# Patient Record
Sex: Male | Born: 1960 | Race: White | Hispanic: No | Smoking: Former smoker
Health system: Southern US, Community
[De-identification: ages and names within clinical notes are randomized; demographics above are authoritative.]

## PROBLEM LIST (undated history)

## (undated) DIAGNOSIS — N200 Calculus of kidney: Secondary | ICD-10-CM

## (undated) DIAGNOSIS — K631 Perforation of intestine (nontraumatic): Secondary | ICD-10-CM

## (undated) HISTORY — PX: PLEURAL SCARIFICATION: SHX748

## (undated) HISTORY — DX: Perforation of intestine (nontraumatic): K63.1

---

## 2005-05-22 ENCOUNTER — Ambulatory Visit: Payer: Self-pay | Admitting: Family Medicine

## 2005-08-07 ENCOUNTER — Ambulatory Visit: Payer: Self-pay | Admitting: Family Medicine

## 2005-09-20 ENCOUNTER — Ambulatory Visit: Payer: Self-pay | Admitting: Family Medicine

## 2005-10-11 ENCOUNTER — Ambulatory Visit: Payer: Self-pay | Admitting: Family Medicine

## 2005-11-15 ENCOUNTER — Ambulatory Visit: Payer: Self-pay | Admitting: Family Medicine

## 2006-01-15 ENCOUNTER — Ambulatory Visit: Payer: Self-pay | Admitting: Family Medicine

## 2006-02-25 DIAGNOSIS — F339 Major depressive disorder, recurrent, unspecified: Secondary | ICD-10-CM | POA: Insufficient documentation

## 2006-02-25 DIAGNOSIS — J309 Allergic rhinitis, unspecified: Secondary | ICD-10-CM | POA: Insufficient documentation

## 2006-03-11 ENCOUNTER — Ambulatory Visit: Payer: Self-pay | Admitting: Family Medicine

## 2006-09-26 ENCOUNTER — Ambulatory Visit: Payer: Self-pay | Admitting: Family Medicine

## 2006-09-26 DIAGNOSIS — J45991 Cough variant asthma: Secondary | ICD-10-CM | POA: Insufficient documentation

## 2006-11-07 ENCOUNTER — Ambulatory Visit: Payer: Self-pay | Admitting: Family Medicine

## 2007-04-07 ENCOUNTER — Ambulatory Visit: Payer: Self-pay | Admitting: Family Medicine

## 2008-03-02 ENCOUNTER — Ambulatory Visit: Payer: Self-pay | Admitting: Family Medicine

## 2008-03-02 ENCOUNTER — Encounter: Admission: RE | Admit: 2008-03-02 | Discharge: 2008-03-02 | Payer: Self-pay | Admitting: Family Medicine

## 2008-03-04 ENCOUNTER — Encounter: Payer: Self-pay | Admitting: Family Medicine

## 2009-03-31 ENCOUNTER — Encounter: Admission: RE | Admit: 2009-03-31 | Discharge: 2009-03-31 | Payer: Self-pay | Admitting: Family Medicine

## 2009-03-31 ENCOUNTER — Ambulatory Visit: Payer: Self-pay | Admitting: Family Medicine

## 2009-08-25 ENCOUNTER — Ambulatory Visit: Payer: Self-pay | Admitting: Family Medicine

## 2010-06-12 ENCOUNTER — Ambulatory Visit
Admission: RE | Admit: 2010-06-12 | Discharge: 2010-06-12 | Payer: Self-pay | Source: Home / Self Care | Admitting: Emergency Medicine

## 2010-06-12 ENCOUNTER — Encounter: Payer: Self-pay | Admitting: Emergency Medicine

## 2010-06-13 ENCOUNTER — Telehealth (INDEPENDENT_AMBULATORY_CARE_PROVIDER_SITE_OTHER): Payer: Self-pay | Admitting: *Deleted

## 2010-06-21 NOTE — Assessment & Plan Note (Signed)
Summary: Sinusitis, Allergies   Vital Signs:  Patient profile:   50 year old male Height:      76 inches Weight:      228 pounds Temp:     98.7 degrees F oral Pulse rate:   79 / minute BP sitting:   123 / 77  (left arm) Cuff size:   large  Vitals Entered By: Kathlene November (August 25, 2009 2:53 PM) CC: nasal drainage, ears hurting, sore throat, sneezing, watery eyes- has bad allergies   Primary Care Provider:  Nani Gasser MD  CC:  nasal drainage, ears hurting, sore throat, sneezing, and watery eyes- has bad allergies.  History of Present Illness: nasal drainage, ears hurting, sore throat, sneezing, watery eyes- has bad allergies for about 2 weeks.  Lots of sinus pressure. Green thick nasal discharge.  havnig some stringy blood. No fever.  Using her claritin and sudafed every day. Also using mucinex.  Feels getting worse. Says his HA is severe.  Not usain any allergic nasal sprays. Wants the "shot that he was given before"    Current Medications (verified): 1)  Claritin 10 Mg Caps (Loratadine) .... Take 1 Tablet By Mouth Once A Day 2)  Sudafed 24 Hour 240 Mg Xr24h-Tab (Pseudoephedrine Hcl) .... Take Two By Mouth Daily  Allergies (verified): 1)  ! Demerol  Physical Exam  General:  Well-developed,well-nourished,in no acute distress; alert,appropriate and cooperative throughout examination Head:  Normocephalic and atraumatic without obvious abnormalities. No apparent alopecia or balding. Eyes:  No corneal or conjunctival inflammation noted. EOMI. Perrla.  Ears:  External ear exam shows no significant lesions or deformities.  Otoscopic examination reveals clear canals, tympanic membranes are intact bilaterally without bulging, retraction, inflammation or discharge. Hearing is grossly normal bilaterally. Nose:  External nasal examination shows no deformity or inflammation. Nasal mucosa are pink and moist without lesions or exudates. Mouth:  Oral mucosa and oropharynx without  lesions or exudates.  Teeth in good repair. Neck:  No deformities, masses, or tenderness noted. Chest Wall:  No deformities, masses, tenderness or gynecomastia noted. Lungs:  Normal respiratory effort, chest expands symmetrically. Lungs are clear to auscultation, no crackles or wheezes. Heart:  Normal rate and regular rhythm. S1 and S2 normal without gallop, murmur, click, rub or other extra sounds. Skin:  no rashes.   Cervical Nodes:  No lymphadenopathy noted Psych:  Cognition and judgment appear intact. Alert and cooperative with normal attention span and concentration. No apparent delusions, illusions, hallucinations   Impression & Recommendations:  Problem # 1:  SINUSITIS - ACUTE-NOS (ICD-461.9)  Will start astepro for acute relief of allergies and tx for bacgerial sinus infection. Explained how adding a nasal spraywill help him.  He was resistant at first because he thought it was like affrin and I expalined that it is different.  Can start ABX today.  Did look back and he was given solumedrol for acute severe allergies in 2008 so given another dose today.  F/u in one week if not better. Cna use IBU and Tylenol for the HA.   His updated medication list for this problem includes:    Sudafed 24 Hour 240 Mg Xr24h-tab (Pseudoephedrine hcl) .Marland Kitchen... Take two by mouth daily    Augmentin 875-125 Mg Tabs (Amoxicillin-pot clavulanate) .Marland Kitchen... Take 1 tablet by mouth two times a day for 10 days.    Astepro 0.15 % Soln (Azelastine hcl) .Marland Kitchen... 1 spray in each nostril once a day.  Orders: Solumedrol up to 125mg  (E4540) Admin of  Therapeutic Inj  intramuscular or subcutaneous (51025)  Problem # 2:  RHINITIS, ALLERGIC (ICD-477.9) Explained how adding a nasal spraywill help him.  He was resistant at first because he thought it was like affrin and I expalined that it is different.   Did look back and he was given solumedrol for acute severe allergies in 2008 so given another dose today. His updated medication  list for this problem includes:    Claritin 10 Mg Caps (Loratadine) .Marland Kitchen... Take 1 tablet by mouth once a day    Sudafed 24 Hour 240 Mg Xr24h-tab (Pseudoephedrine hcl) .Marland Kitchen... Take two by mouth daily    Astepro 0.15 % Soln (Azelastine hcl) .Marland Kitchen... 1 spray in each nostril once a day.  His updated medication list for this problem includes:    Claritin 10 Mg Caps (Loratadine) .Marland Kitchen... Take 1 tablet by mouth once a day    Sudafed 24 Hour 240 Mg Xr24h-tab (Pseudoephedrine hcl) .Marland Kitchen... Take two by mouth daily    Astepro 0.15 % Soln (Azelastine hcl) .Marland Kitchen... 1 spray in each nostril once a day.  Complete Medication List: 1)  Claritin 10 Mg Caps (Loratadine) .... Take 1 tablet by mouth once a day 2)  Sudafed 24 Hour 240 Mg Xr24h-tab (Pseudoephedrine hcl) .... Take two by mouth daily 3)  Augmentin 875-125 Mg Tabs (Amoxicillin-pot clavulanate) .... Take 1 tablet by mouth two times a day for 10 days. 4)  Astepro 0.15 % Soln (Azelastine hcl) .Marland Kitchen.. 1 spray in each nostril once a day.  Patient Instructions: 1)  Astepro 1 spray in each nostril two times a day for about 1 month.  2)  Call if not better in one week.  Prescriptions: AUGMENTIN 875-125 MG TABS (AMOXICILLIN-POT CLAVULANATE) Take 1 tablet by mouth two times a day for 10 days.  #20 x 0   Entered and Authorized by:   Nani Gasser MD   Signed by:   Nani Gasser MD on 08/25/2009   Method used:   Electronically to        [facility] # [phone]* (retail)       [address]       [place], [place]  [address]       Ph: [phone]       Fax: [phone]   RxID:   [id number]    Medication Administration  Injection # 1:    Medication: Solumedrol up to 125mg     Diagnosis: SINUSITIS - ACUTE-NOS (ICD-461.9)    Route: IM    Site: LUOQ gluteus    Exp Date: 12/19/2010    Lot #: [id number]    Mfr: Pharmacia    Patient tolerated injection without complications    Given by: Kathlene November (August 25, 2009 3:18 PM)  Orders  Added: 1)  Est. Patient Level III [38250] 2)  Solumedrol up to 125mg  [J2930] 3)  Admin of Therapeutic Inj  intramuscular or subcutaneous [53976]

## 2010-06-21 NOTE — Progress Notes (Signed)
  Phone Note Outgoing Call   Call placed by: Clemens Catholic LPN,  June 13, 2010 9:57 AM Call placed to: pts mother Summary of Call: call back:pts mom states that he is feeling a little better today. he is resting and able to keep ksome fluids down. advised mom to have him to call back if any questions or concerns. Initial call taken by: Clemens Catholic LPN,  June 13, 2010 10:04 AM

## 2010-06-21 NOTE — Letter (Signed)
Summary: Out of Work  [facility]  [address]   [place], [place] [address]   Phone: [phone]  Fax: [phone]    June 12, 2010   Employee:  [employer] [facility]    To Whom It May Concern:   For Medical reasons, please excuse the above named employee from work for the following dates:  June 12, 2010      If you need additional information, please feel free to contact our office.         Sincerely,    Hoyt Koch MD

## 2010-06-21 NOTE — Assessment & Plan Note (Signed)
Summary: VOMITING/DIARRHEA/NH   Vital Signs:  Patient Profile:   50 Years Old Male CC:      N/V/D, abdominal pain  x last night, HA and vomiting 4 days ago Height:     76 inches Weight:      221 pounds O2 Sat:      96 % O2 treatment:    Room Air Temp:     98.9 degrees F oral Pulse rate:   76 / minute Resp:     14 per minute BP sitting:   113 / 77  (left arm) Cuff size:   large  Vitals Entered By: Lajean Saver RN (June 12, 2010 9:18 AM)                  Updated Prior Medication List: CLARITIN 10 MG CAPS (LORATADINE) Take 1 tablet by mouth once a day ASTEPRO 0.15 % SOLN (AZELASTINE HCL) 1 spray in each nostril once a day.  Current Allergies (reviewed today): ! DEMEROLHistory of Present Illness History from: patient & mother Chief Complaint: N/V/D, abdominal pain  x last night, HA and vomiting 4 days ago History of Present Illness: +N/V/D over the past 4 days.  The worst is the V and N.  Mild dizziness.  Was feeling better yesterday and then went to [employer] and then started throwing up again.  He has had multiple contacts with people with simimilar symptoms in the past week.  Mild HA and fatigue.  No F/C.  No blood in emesis or diarrhea.  He did keep down Gatorade this morning.  REVIEW OF SYSTEMS Constitutional Symptoms      Denies fever, chills, night sweats, weight loss, weight gain, and fatigue.  Eyes       Denies change in vision, eye pain, eye discharge, glasses, contact lenses, and eye surgery. Ear/Nose/Throat/Mouth       Denies hearing loss/aids, change in hearing, ear pain, ear discharge, dizziness, frequent runny nose, frequent nose bleeds, sinus problems, sore throat, hoarseness, and tooth pain or bleeding.  Respiratory       Denies dry cough, productive cough, wheezing, shortness of breath, asthma, bronchitis, and emphysema/COPD.  Cardiovascular       Denies murmurs, chest pain, and tires easily with exhertion.    Gastrointestinal       Complains of stomach  pain, nausea/vomiting, and diarrhea.      Denies constipation, blood in bowel movements, and indigestion. Genitourniary       Denies painful urination, kidney stones, and loss of urinary control. Neurological       Complains of headaches.      Denies paralysis, seizures, and fainting/blackouts. Musculoskeletal       Denies muscle pain, joint pain, joint stiffness, decreased range of motion, redness, swelling, muscle weakness, and gout.  Skin       Denies bruising, unusual mles/lumps or sores, and hair/skin or nail changes.  Psych       Denies mood changes, temper/anger issues, anxiety/stress, speech problems, depression, and sleep problems. Other Comments: Patient c/o HA and vomiting 4 nights ago. Last night he developed N/V/D and abdominal pain.    Past History:  Past Medical History: AR asthma depression? Hx of Migraines  Past Surgical History: _Sinus surgery  Family History: Parkinson`s-father died, DM  Social History: Reviewed history from 02/25/2006 and no changes required. 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 Never smoked, no EtOH, no drugs, 4 caffeinated drinks per day.  No regular exercise. Physical Exam General appearance:  well developed, well nourished, mild-mod distress from nausea but improving with Zofran, does not appear acutely dehydrated Ears: normal, no lesions or deformities Nasal: mucosa pink, nonedematous, no septal deviation, turbinates normal Oral/Pharynx: tongue normal, posterior pharynx without erythema or exudate Chest/Lungs: no rales, wheezes, or rhonchi bilateral, breath sounds equal without effort Heart: regular rate and  rhythm, no murmur Abdomen: mild diffuse tenderness worse epigastric, abdomen soft without obvious organomegaly.  no guarding, no rebound, no RLQ pain, +BS4Q MSE: oriented to time, place, and person Assessment New Problems: NAUSEA WITH VOMITING (ICD-787.01)   Patient Education: Patient and/or caregiver  instructed in the following: rest, fluids. Demonstrates willingness to comply.  Plan New Medications/Changes: MECLIZINE HCL 25 MG TABS (MECLIZINE HCL) 1 by mouth q6 hrs as needed dizziness  #16 x 0, 06/12/2010, Hoyt Koch MD ZOFRAN ODT 4 MG TBDP (ONDANSETRON) 1 dissolvable tab by mouth Q4-6 hrs as needed for nausea  #16 x 0, 06/12/2010, Hoyt Koch MD  New Orders: Zofran 1mg . injection [J2405] Admin of Therapeutic Inj  intramuscular or subcutaneous [96372] New Patient Level III [[address]] Planning Comments:   Rx for Zofran ODT for nausea and Antivert for dizziness.  A 4mg  IM Zofran given in clinic and he is already feeling a little better.  Educated on bland BRAT diet for the next 5-7 days, hydrating with water and Gatorade, eating smaller but more frequent portions.  Work note given.  Keep clean and hygeinic to avoid passing to others.  Follow-up with your primary care physician if not improving or if getting worse or new more severe symptoms.   The patient and/or caregiver has been counseled thoroughly with regard to medications prescribed including dosage, schedule, interactions, rationale for use, and possible side effects and they verbalize understanding.  Diagnoses and expected course of recovery discussed and will return if not improved as expected or if the condition worsens. Patient and/or caregiver verbalized understanding.  Prescriptions: MECLIZINE HCL 25 MG TABS (MECLIZINE HCL) 1 by mouth q6 hrs as needed dizziness  #16 x 0   Entered and Authorized by:   Hoyt Koch MD   Signed by:   Hoyt Koch MD on 06/12/2010   Method used:   Print then Give to Patient   RxID:   [id number] ZOFRAN ODT 4 MG TBDP (ONDANSETRON) 1 dissolvable tab by mouth Q4-6 hrs as needed for nausea  #16 x 0   Entered and Authorized by:   Hoyt Koch MD   Signed by:   Hoyt Koch MD on 06/12/2010   Method used:   Print then Give to Patient   RxID:    [id number]   Medication Administration  Injection # 1:    Medication: Zofran 1mg . injection    Diagnosis: NAUSEA WITH VOMITING (ICD-787.01)    Route: IM    Site: LUOQ gluteus    Exp Date: 10/18/2012    Lot #: [id number]    Mfr: HIKMA    Patient tolerated injection without complications    Given by: Lajean Saver RN (June 12, 2010 9:22 AM)  Orders Added: 1)  Zofran 1mg . injection [J2405] 2)  Admin of Therapeutic Inj  intramuscular or subcutaneous [96372] 3)  New Patient Level III [32440]

## 2010-07-02 ENCOUNTER — Ambulatory Visit (INDEPENDENT_AMBULATORY_CARE_PROVIDER_SITE_OTHER): Payer: Commercial Indemnity | Admitting: Family Medicine

## 2010-07-02 ENCOUNTER — Encounter: Payer: Self-pay | Admitting: Family Medicine

## 2010-07-02 DIAGNOSIS — J029 Acute pharyngitis, unspecified: Secondary | ICD-10-CM | POA: Insufficient documentation

## 2010-07-03 ENCOUNTER — Ambulatory Visit: Payer: Self-pay | Admitting: Family Medicine

## 2010-07-11 NOTE — Assessment & Plan Note (Signed)
Summary: Sore Throat    Vital Signs:  Patient profile:   50 year old male Height:      76 inches Weight:      225 pounds Temp:     98.1 degrees F oral Pulse rate:   67 / minute BP sitting:   118 / 76  (right arm) Cuff size:   large  Vitals Entered By: Avon Gully CMA, Duncan Dull) (July 02, 2010 1:03 PM)  Primary Care Provider:  Nani Gasser MD   History of Present Illness: ST and ear pressure started today.  Feels a little bad yesterday. Some green nasal d/c this AM. No fever. No Cough. NO HA or facial pressure. Louann Sjogren has some type of febril illness last week. Says he is worried about missing work this week.   Habits & Providers  Alcohol-Tobacco-Diet     Tobacco Status: never  Allergies: 1)  ! Demerol  Physical Exam  General:  Well-developed,well-nourished,in no acute distress; alert,appropriate and cooperative throughout examination Head:  Normocephalic and atraumatic without obvious abnormalities. No apparent alopecia or balding. Eyes:  No corneal or conjunctival inflammation noted. EOMI. Perrla. Ears:  External ear exam shows no significant lesions or deformities.  Otoscopic examination reveals clear canals, tympanic membranes are intact bilaterally without bulging, retraction, inflammation or discharge. Hearing is grossly normal bilaterally. Nose:  External nasal examination shows no deformity or inflammation.  Mouth:  Oral mucosa and oropharynx without lesions or exudates. Mild cobbelstoning.  Teeth in good repair. Neck:  No deformities, masses, or tenderness noted. Lungs:  Normal respiratory effort, chest expands symmetrically. Lungs are clear to auscultation, no crackles or wheezes. Heart:  Normal rate and regular rhythm. S1 and S2 normal without gallop, murmur, click, rub or other extra sounds. Skin:  no rashes.   Cervical Nodes:  No lymphadenopathy noted Psych:  Cognition and judgment appear intact. Alert and cooperative with normal attention span and  concentration. No apparent delusions, illusions, hallucinations   Impression & Recommendations:  Problem # 1:  SORE THROAT (ICD-462)  Rapid strep is neg. He has no cerv LN and throat with minimal changes and no fever so likely viral. Call if not better in 5-6 days.   Orders: Rapid Strep ([address])  Complete Medication List: 1)  Claritin 10 Mg Caps (Loratadine) .... Take 1 tablet by mouth once a day 2)  Astepro 0.15 % Soln (Azelastine hcl) .Marland Kitchen.. 1 spray in each nostril once a day. 3)  Zofran Odt 4 Mg Tbdp (Ondansetron) .Marland Kitchen.. 1 dissolvable tab by mouth q4-6 hrs as needed for nausea 4)  Meclizine Hcl 25 Mg Tabs (Meclizine hcl) .Marland Kitchen.. 1 by mouth q6 hrs as needed dizziness  Patient Instructions: 1)   Call if not better in 5-6 days.    Orders Added: 1)  Est. Patient Level III [60454] 2)  Rapid Strep [09811]    Laboratory Results  Date/Time Received: 07/02/10 Date/Time Reported: 07/02/10  Other Tests  Rapid Strep: negative

## 2011-04-05 ENCOUNTER — Ambulatory Visit
Admission: RE | Admit: 2011-04-05 | Discharge: 2011-04-05 | Disposition: A | Discharge status: Home / Self Care | Payer: 59 | Source: Ambulatory Visit | Attending: Family Medicine | Admitting: Family Medicine

## 2011-04-05 ENCOUNTER — Ambulatory Visit (INDEPENDENT_AMBULATORY_CARE_PROVIDER_SITE_OTHER): Payer: 59 | Admitting: Family Medicine

## 2011-04-05 VITALS — BP 140/84 | HR 85 | Wt 226.0 lb

## 2011-04-05 DIAGNOSIS — S2239XA Fracture of one rib, unspecified side, initial encounter for closed fracture: Secondary | ICD-10-CM

## 2011-04-05 DIAGNOSIS — S43006A Unspecified dislocation of unspecified shoulder joint, initial encounter: Secondary | ICD-10-CM

## 2011-04-05 DIAGNOSIS — J9383 Other pneumothorax: Secondary | ICD-10-CM

## 2011-04-05 NOTE — Patient Instructions (Signed)
We will call you with the chest xray results.  
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
## 2011-04-18 ENCOUNTER — Other Ambulatory Visit: Payer: Self-pay | Admitting: Sports Medicine

## 2011-04-18 ENCOUNTER — Ambulatory Visit
Admission: RE | Admit: 2011-04-18 | Discharge: 2011-04-18 | Disposition: A | Discharge status: Home / Self Care | Payer: 59 | Source: Ambulatory Visit | Attending: Sports Medicine | Admitting: Sports Medicine

## 2011-04-18 DIAGNOSIS — S43006A Unspecified dislocation of unspecified shoulder joint, initial encounter: Secondary | ICD-10-CM

## 2011-05-30 ENCOUNTER — Emergency Department
Admission: EM | Admit: 2011-05-30 | Discharge: 2011-05-30 | Disposition: A | Discharge status: Home / Self Care | Payer: 59 | Source: Home / Self Care | Attending: Family Medicine | Admitting: Family Medicine

## 2011-05-30 DIAGNOSIS — R0781 Pleurodynia: Secondary | ICD-10-CM

## 2011-05-30 DIAGNOSIS — R079 Chest pain, unspecified: Secondary | ICD-10-CM

## 2011-05-30 DIAGNOSIS — J069 Acute upper respiratory infection, unspecified: Secondary | ICD-10-CM

## 2011-05-30 LAB — POCT INFLUENZA A/B: Influenza A, POC: NEGATIVE

## 2011-05-30 MED ORDER — CEFDINIR 300 MG PO CAPS: 300.0000 mg | ORAL_CAPSULE | Freq: Two times a day (BID) | ORAL | Status: DC

## 2011-05-30 MED ORDER — BENZONATATE 200 MG PO CAPS: 200.0000 mg | ORAL_CAPSULE | Freq: Every day | ORAL | Status: AC

## 2011-05-30 NOTE — ED Notes (Signed)
Pt c/o productive cough onset Monday.  Pt describes mucous as green in color.  Pt states he had one episode of emesis last night, denies diarrhea.  Pt states he has been using humidifier with no relief.
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
## 2011-07-25 ENCOUNTER — Ambulatory Visit
Admission: RE | Admit: 2011-07-25 | Discharge: 2011-07-25 | Disposition: A | Discharge status: Home / Self Care | Payer: 59 | Source: Ambulatory Visit | Attending: Sports Medicine | Admitting: Sports Medicine

## 2011-07-25 ENCOUNTER — Other Ambulatory Visit: Payer: Self-pay | Admitting: Sports Medicine

## 2011-07-25 DIAGNOSIS — R52 Pain, unspecified: Secondary | ICD-10-CM

## 2011-10-03 ENCOUNTER — Ambulatory Visit
Admission: RE | Admit: 2011-10-03 | Discharge: 2011-10-03 | Disposition: A | Discharge status: Home / Self Care | Payer: BC Managed Care – PPO | Source: Ambulatory Visit | Attending: Sports Medicine | Admitting: Sports Medicine

## 2011-10-03 ENCOUNTER — Other Ambulatory Visit: Payer: Self-pay | Admitting: Sports Medicine

## 2011-10-03 DIAGNOSIS — S2239XB Fracture of one rib, unspecified side, initial encounter for open fracture: Secondary | ICD-10-CM

## 2012-10-18 DIAGNOSIS — K631 Perforation of intestine (nontraumatic): Secondary | ICD-10-CM

## 2012-10-18 HISTORY — DX: Perforation of intestine (nontraumatic): K63.1

## 2012-10-18 HISTORY — PX: PARTIAL COLECTOMY: SHX5273

## 2012-11-02 ENCOUNTER — Emergency Department
Admission: EM | Admit: 2012-11-02 | Discharge: 2012-11-02 | Disposition: A | Discharge status: Other Acute Inpatient Hospital | Payer: BC Managed Care – PPO | Source: Home / Self Care | Attending: Family Medicine | Admitting: Family Medicine

## 2012-11-02 ENCOUNTER — Encounter: Payer: Self-pay | Admitting: *Deleted

## 2012-11-02 DIAGNOSIS — R109 Unspecified abdominal pain: Secondary | ICD-10-CM

## 2012-11-02 HISTORY — DX: Calculus of kidney: N20.0

## 2012-11-02 MED ORDER — ONDANSETRON HCL 4 MG/2ML IJ SOLN
4.0000 mg | Freq: Once | INTRAMUSCULAR | Status: AC
  Administered 2012-11-02: 4 mg via INTRAMUSCULAR

## 2012-11-02 NOTE — ED Provider Notes (Addendum)
History     CSN: [id number]  Arrival date & time 11/02/12  1453   First MD Initiated Contact with Patient 11/02/12 1502      Chief Complaint  Patient presents with  . Abdominal Pain    HPI  Abdominal pain and vomiting x 1 day  Woke up this am with severe pain  10/10  Generalized abdominal  Weakness Malaise.  Emesis non bilious/non bloody  No known GI history    No past medical history on file.  Past Surgical History  Procedure Laterality Date  . Pleural scarification      No family history on file.  History  Substance Use Topics  . Smoking status: Never Smoker   . Smokeless tobacco: Not on file  . Alcohol Use: No      Review of Systems  All other systems reviewed and are negative.    Allergies  Dilaudid; Meperidine hcl; and Morphine and related  Home Medications   Current Outpatient Rx  Name  Route  Sig  Dispense  Refill  . Cetirizine HCl (ZYRTEC PO)   Oral   Take by mouth.         . GuaiFENesin (MUCINEX PO)   Oral   Take by mouth.         Marland Kitchen ibuprofen (ADVIL,MOTRIN) 100 MG chewable tablet   Oral   Chew 100 mg by mouth every 8 (eight) hours as needed.           Marland Kitchen ibuprofen (ADVIL,MOTRIN) 200 MG tablet   Oral   Take 200 mg by mouth every 6 (six) hours as needed.           . loratadine (CLARITIN) 10 MG tablet   Oral   Take 10 mg by mouth daily.             BP 99/62  Pulse 72  Temp(Src) 98.6 F (37 C) (Oral)  Resp 22  SpO2 92%  Physical Exam  Constitutional: He appears distressed.  Mildly lethargic    Cardiovascular: Normal rate and regular rhythm.   Pulmonary/Chest: Effort normal.  Abdominal:  + diffuse abd TTP  Decreased bowel sounds    Neurological: He is alert.  Skin: Skin is warm. He is diaphoretic.    ED Course  Procedures (including critical care time)  Labs Reviewed - No data to display No results found.   1. Abdominal  pain, other specified site       MDM  DDx extremely broad including bowel  obstruction, gastritis, appendicitis, colitis.  Supplemental O2 placed.  IM zofran 4mg  x1  VSS stable. Tx to ER via EMS.             Doree Albee, MD 11/02/12 1516  Doree Albee, MD 11/02/12 [phone]

## 2012-11-02 NOTE — ED Notes (Addendum)
Terry Vaughan reports severe sudden onset abdominal pain @ 10 am today. Pain has been constant. He reports chills and vomiting, pain 10/10. He is lethargic and diaphoretic at times hard to awaken. 02 applied via [place]. Dr. Alvester Morin notified of patient's condition.

## 2012-11-05 ENCOUNTER — Telehealth: Payer: Self-pay | Admitting: *Deleted

## 2012-11-18 ENCOUNTER — Encounter: Payer: Self-pay | Admitting: Surgery

## 2012-11-27 ENCOUNTER — Telehealth: Payer: Self-pay | Admitting: *Deleted

## 2012-11-27 NOTE — Telephone Encounter (Addendum)
Pt's mother is calling because he needs something for anxiety. He has an appt on Wednesday. Dr. Linford Arnold will not call any meds in for him w/o being seen. called his mother back and informed her of this and made an appt for monday she will try to get him in if she can get a ride. i did not cancel the appt for wednesday in case she can not make it. she understood and agreedBarkley, Viann Shove'

## 2012-11-30 ENCOUNTER — Ambulatory Visit (INDEPENDENT_AMBULATORY_CARE_PROVIDER_SITE_OTHER): Payer: BC Managed Care – PPO | Admitting: Family Medicine

## 2012-11-30 ENCOUNTER — Encounter: Payer: Self-pay | Admitting: Family Medicine

## 2012-11-30 VITALS — BP 106/67 | HR 93 | Ht 72.0 in | Wt 207.0 lb

## 2012-11-30 DIAGNOSIS — F411 Generalized anxiety disorder: Secondary | ICD-10-CM

## 2012-11-30 DIAGNOSIS — B370 Candidal stomatitis: Secondary | ICD-10-CM

## 2012-11-30 DIAGNOSIS — R3911 Hesitancy of micturition: Secondary | ICD-10-CM

## 2012-11-30 DIAGNOSIS — K631 Perforation of intestine (nontraumatic): Secondary | ICD-10-CM

## 2012-11-30 DIAGNOSIS — K5732 Diverticulitis of large intestine without perforation or abscess without bleeding: Secondary | ICD-10-CM

## 2012-11-30 MED ORDER — FLUOXETINE HCL 10 MG PO CAPS: ORAL_CAPSULE | ORAL | Status: DC

## 2012-11-30 MED ORDER — FLUCONAZOLE 150 MG PO TABS: 150.0000 mg | ORAL_TABLET | Freq: Every day | ORAL | Status: DC

## 2012-11-30 MED ORDER — CLONAZEPAM 0.25 MG PO TBDP: 0.2500 mg | ORAL_TABLET | Freq: Every evening | ORAL | Status: DC | PRN

## 2012-11-30 NOTE — Progress Notes (Signed)
  Subjective:    Patient ID: Terry Vaughan, male    DOB: July 26, 1960, 52 y.o.   MRN: [id number]  HPI Hospitalized for 14 days for bowel perforation.  His sister has been doing his wound care.  His anxiety level has been out of control.  Still awaiting ostomy supples.  They are hoping to reverse it in 6 months.  His sister is actually doing his wound care which is fantastic. He has had very little pain and discomfort. The last time he had to take a pain medication was about 2 days ago at bedtime. He has noticed a lot of general soreness in his abdomen.  Throat is still really sore.  He pulled his NG tube right after surgery adn they had to replace it.   He is comparable bottle Magic mouthwash and says his throat is still sore and tender.  Still having diffuculty starting urinary stream. After having his urinary catheter remained he had problems starting urination. He was put on Flomax in the hospital. He was unsure if he should continue or not. He is still noticed a little bit of difficulty starting urination a little bit weaker stream than usual. Still having dysuria that started immediately after having the catheter removed. No hematuria.   Review of Systems     Objective:   Physical Exam  Constitutional: He is oriented to person, place, and time. He appears well-developed and well-nourished.  HENT:  Head: Normocephalic and atraumatic.  Nose: Nose normal.  Mouth/Throat: Oropharynx is clear and moist.  Cardiovascular: Normal rate, regular rhythm and normal heart sounds.   Pulmonary/Chest: Effort normal and breath sounds normal.  Abdominal: Soft. Bowel sounds are normal. He exhibits no distension and no mass. There is no tenderness. There is no rebound and no guarding.  Ostomy site appears clean and intact. There is no stool in the bag.  Neurological: He is alert and oriented to person, place, and time.  Skin: Skin is warm and dry.  Psychiatric: He has a normal mood and affect. His  behavior is normal.          Assessment & Plan:  GI perforation secondary to diverticulitis - Healing well. Has f/u with GI [facility] with Dr. Charise Carwin.    Thrush - I. was unable to see any evidence of thrush on exam he still has a very sore throat after using the Magic mouthwash. We'll go ahead and treat with 3 days of Diflucan. If his throat is still sore by Monday then please call the office back.  Anxiety-this is been very anxiety provoking for him he still having a very difficult time with a peer with discussed putting him on an anxiolytic medication for a short period time. Probably over the next 6 months until they're able to reverse his ileostomy. He said he was okay with doing this. We'll start fluoxetine 10 mg daily for one week and then increase to 20 mg. I gave him a small supply of clonazepam to use at bedtime only at least until I see him back. Warned about potential for sedation and to avoid taking before driving.  Urinary hesitancy-continue Flomax for now. He still having difficulty starting history and has numbness and weakness since being catheterized at the hospital. He was started on the Flomax at the hospital. Refill sent to pharmacy today. It is improving as is see him back then we'll discontinue the medication.

## 2012-12-01 ENCOUNTER — Encounter: Payer: Self-pay | Admitting: Family Medicine

## 2012-12-02 ENCOUNTER — Telehealth: Payer: Self-pay | Admitting: *Deleted

## 2012-12-02 ENCOUNTER — Ambulatory Visit: Payer: BC Managed Care – PPO | Admitting: Family Medicine

## 2012-12-02 MED ORDER — TAMSULOSIN HCL 0.4 MG PO CAPS: 0.4000 mg | ORAL_CAPSULE | Freq: Every day | ORAL | Status: DC

## 2012-12-02 NOTE — Telephone Encounter (Signed)
[facility] called & said that pt was expecting a refill on his tamsulosin.  I see it on his med list but theres no dispense amount & refill.  Are you ok with refilling this? Please advise of quantity & refill if so & I will give verbal refill over the phone.

## 2012-12-02 NOTE — Telephone Encounter (Signed)
Sent rx.  i didn't put refills as hopefully this is short term

## 2012-12-03 DIAGNOSIS — IMO0002 Reserved for concepts with insufficient information to code with codable children: Secondary | ICD-10-CM | POA: Insufficient documentation

## 2012-12-21 ENCOUNTER — Ambulatory Visit: Payer: BC Managed Care – PPO | Admitting: Family Medicine

## 2012-12-23 ENCOUNTER — Encounter: Payer: Self-pay | Admitting: Family Medicine

## 2012-12-23 ENCOUNTER — Telehealth: Payer: Self-pay | Admitting: *Deleted

## 2012-12-23 ENCOUNTER — Ambulatory Visit (INDEPENDENT_AMBULATORY_CARE_PROVIDER_SITE_OTHER): Payer: BC Managed Care – PPO | Admitting: Family Medicine

## 2012-12-23 VITALS — BP 114/65 | HR 65 | Wt 209.0 lb

## 2012-12-23 DIAGNOSIS — Z13220 Encounter for screening for lipoid disorders: Secondary | ICD-10-CM

## 2012-12-23 DIAGNOSIS — F411 Generalized anxiety disorder: Secondary | ICD-10-CM

## 2012-12-23 MED ORDER — FLUOXETINE HCL 40 MG PO CAPS: 40.0000 mg | ORAL_CAPSULE | Freq: Every day | ORAL | Status: DC

## 2012-12-23 NOTE — Telephone Encounter (Signed)
Request for pt's imm. Records faxed.Terry Vaughan
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
## 2012-12-23 NOTE — Patient Instructions (Signed)
We will increease your prozac to 40mg .

## 2012-12-30 ENCOUNTER — Other Ambulatory Visit: Payer: Self-pay | Admitting: Family Medicine

## 2013-01-04 ENCOUNTER — Other Ambulatory Visit: Payer: Self-pay | Admitting: Family Medicine

## 2013-01-29 ENCOUNTER — Other Ambulatory Visit: Payer: Self-pay | Admitting: Family Medicine

## 2013-03-04 ENCOUNTER — Other Ambulatory Visit: Payer: Self-pay | Admitting: Family Medicine

## 2013-03-04 ENCOUNTER — Telehealth: Payer: Self-pay | Admitting: *Deleted

## 2013-03-04 NOTE — Telephone Encounter (Signed)
Needs f/u appt before future refills 

## 2013-03-04 NOTE — Telephone Encounter (Signed)
Sent refill of klonopin to pharm but pt does need f/u appt before future refills.

## 2013-04-05 ENCOUNTER — Other Ambulatory Visit: Payer: Self-pay | Admitting: Family Medicine

## 2013-04-05 ENCOUNTER — Other Ambulatory Visit: Payer: Self-pay | Admitting: *Deleted

## 2013-04-05 MED ORDER — CLONAZEPAM 0.25 MG PO TBDP: ORAL_TABLET | ORAL | Status: DC

## 2013-05-04 ENCOUNTER — Other Ambulatory Visit: Payer: Self-pay | Admitting: Physician Assistant

## 2013-05-04 ENCOUNTER — Other Ambulatory Visit: Payer: Self-pay | Admitting: Family Medicine

## 2013-05-05 DIAGNOSIS — Z9889 Other specified postprocedural states: Secondary | ICD-10-CM | POA: Insufficient documentation

## 2013-05-18 ENCOUNTER — Ambulatory Visit (INDEPENDENT_AMBULATORY_CARE_PROVIDER_SITE_OTHER): Payer: BC Managed Care – PPO | Admitting: Family Medicine

## 2013-05-18 ENCOUNTER — Encounter: Payer: Self-pay | Admitting: Family Medicine

## 2013-05-18 VITALS — BP 124/84 | HR 60 | Temp 98.4°F | Wt 221.0 lb

## 2013-05-18 DIAGNOSIS — F329 Major depressive disorder, single episode, unspecified: Secondary | ICD-10-CM

## 2013-05-18 DIAGNOSIS — F32A Depression, unspecified: Secondary | ICD-10-CM

## 2013-05-18 DIAGNOSIS — F411 Generalized anxiety disorder: Secondary | ICD-10-CM

## 2013-05-18 DIAGNOSIS — Z9889 Other specified postprocedural states: Secondary | ICD-10-CM

## 2013-05-18 NOTE — Progress Notes (Signed)
   Subjective:    Patient ID: Terry Vaughan, male    DOB: [birth year], 52 y.o.   MRN: [id number]  HPI Patient was admitted to the hospital on December 17 for a colostomy takedown. He was admitted for 7 days and was discharged on December 24. he has had significant discomfort since then but has not wanted to take the pain medication given for fear constipation. He's been having frequent loose stools. He feels his anxiety has been out of control since then. Today pain is 10/10.  Went back to work today.  Said he really wasn't given instructions about returning to work but he knows he is not to lift more than 20 lbs or drive yet.    Had colonoscopy done in December at [facility].    Anxiety-his chest levels have been elevated since the surgery. He's been very uncomfortable and has been distressed by the frequent bowel movements. He knows that it will get better and knows it will take time but has been very distressing in the meantime. He called the on-call nurse over the weekend who spoke with me. And I have okayed him increasing his clonazepam to twice a day, one in the morning and one in the evening until he saw me today. He is taking the fluoxetine once a day. He says personally he hasn't noticed a big difference but his mother who is here with him today says she has noticed a very big difference. He's been much more calm throughout all the situations that have occurred since he had the colostomy bag placed and now reversed.  Review of Systems     Objective:   Physical Exam  Constitutional: He is oriented to person, place, and time. He appears well-developed and well-nourished.  HENT:  Head: Normocephalic and atraumatic.  Cardiovascular: Normal rate, regular rhythm and normal heart sounds.   Pulmonary/Chest: Effort normal and breath sounds normal.  Abdominal: Soft.  Incision is healing very nicely. No induration around the incision. The staples are still in place. No active drainage. He  does still have some swelling around the middle of the incision and some bruising along the right side of the abdomen.  Neurological: He is alert and oriented to person, place, and time.  Skin: Skin is warm and dry.  Psychiatric: He has a normal mood and affect. His behavior is normal.          Assessment & Plan:  Depression/Anxiety - increased anxiety lately.  Have increased clonazepam dose to BID for now.  Hopefull go back down to once a day after a couple of weeks. We can also look at increasing the fluoxetine to 40 mg. He will try taking to the 20 mg tabs and see if this is helpful.  S/p colostomy takedown- healing well.  Getting staples removed tomorrow.  Ask about work restrictions.  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  Time spent 30 minutes, greater than 50% the time spent counseling about depression/anxiety/colostomy.

## 2013-06-10 ENCOUNTER — Other Ambulatory Visit: Payer: Self-pay | Admitting: Family Medicine

## 2013-06-15 ENCOUNTER — Other Ambulatory Visit: Payer: Self-pay | Admitting: Family Medicine

## 2013-07-10 ENCOUNTER — Other Ambulatory Visit: Payer: Self-pay | Admitting: Family Medicine

## 2013-07-13 ENCOUNTER — Other Ambulatory Visit: Payer: Self-pay | Admitting: Family Medicine

## 2013-07-13 ENCOUNTER — Other Ambulatory Visit: Payer: Self-pay | Admitting: *Deleted

## 2013-07-13 MED ORDER — CLONAZEPAM 0.25 MG PO TBDP: ORAL_TABLET | ORAL | Status: DC

## 2013-07-13 MED ORDER — FLUOXETINE HCL 40 MG PO CAPS: ORAL_CAPSULE | ORAL | Status: DC

## 2013-08-10 ENCOUNTER — Other Ambulatory Visit: Payer: Self-pay | Admitting: Family Medicine

## 2013-08-12 ENCOUNTER — Other Ambulatory Visit: Payer: Self-pay | Admitting: Physician Assistant

## 2013-09-09 ENCOUNTER — Other Ambulatory Visit: Payer: Self-pay | Admitting: Physician Assistant

## 2013-09-15 ENCOUNTER — Other Ambulatory Visit: Payer: Self-pay | Admitting: Physician Assistant

## 2013-09-17 HISTORY — PX: CHOLECYSTECTOMY: SHX55

## 2013-10-12 ENCOUNTER — Other Ambulatory Visit: Payer: Self-pay | Admitting: Family Medicine

## 2013-10-14 ENCOUNTER — Telehealth: Payer: Self-pay | Admitting: *Deleted

## 2013-10-14 MED ORDER — CLONAZEPAM 0.25 MG PO TBDP: ORAL_TABLET | ORAL | Status: DC

## 2013-10-14 NOTE — Telephone Encounter (Signed)
Pt's mother called and stated that he needs a refill of his medication and due to his work schedule he is not able to get in to be seen until 6.4. Will send rx and told that he must f/u otherwise we cannot provide future refills. She voiced understanding and agreed.Deno Etienne

## 2013-10-19 ENCOUNTER — Ambulatory Visit: Payer: BC Managed Care – PPO | Admitting: Family Medicine

## 2013-10-19 ENCOUNTER — Other Ambulatory Visit: Payer: Self-pay | Admitting: Family Medicine

## 2013-10-21 ENCOUNTER — Ambulatory Visit (INDEPENDENT_AMBULATORY_CARE_PROVIDER_SITE_OTHER): Payer: BC Managed Care – PPO | Admitting: Family Medicine

## 2013-10-21 ENCOUNTER — Encounter: Payer: Self-pay | Admitting: Family Medicine

## 2013-10-21 VITALS — BP 121/75 | HR 66 | Ht 72.0 in | Wt 229.0 lb

## 2013-10-21 DIAGNOSIS — H9201 Otalgia, right ear: Secondary | ICD-10-CM

## 2013-10-21 DIAGNOSIS — H9209 Otalgia, unspecified ear: Secondary | ICD-10-CM

## 2013-10-21 DIAGNOSIS — J45991 Cough variant asthma: Secondary | ICD-10-CM

## 2013-10-21 DIAGNOSIS — Z9049 Acquired absence of other specified parts of digestive tract: Secondary | ICD-10-CM | POA: Insufficient documentation

## 2013-10-21 DIAGNOSIS — F339 Major depressive disorder, recurrent, unspecified: Secondary | ICD-10-CM

## 2013-10-21 DIAGNOSIS — Z9089 Acquired absence of other organs: Secondary | ICD-10-CM

## 2013-10-21 MED ORDER — ACETIC ACID 2 % OT SOLN: 4.0000 [drp] | Freq: Three times a day (TID) | OTIC | Status: DC | PRN

## 2013-10-21 NOTE — Progress Notes (Signed)
Subjective:    Patient ID: Terry Vaughan, male    DOB: 01-07-1961, 53 y.o.   MRN: [id number]  HPI [place] surgery 3-4 wks ago. Here for hospital f/u today.  Followed with the surgeon and every has healed well.    F/U asthma - Hasn't used inhaler in months.  No recent sxs.  Has been on Zyrtec for spring allergies.  Right ear pain x 2 weeks. Will have a crawling sensation.  Says then will start to ache. Usually starts in the afternoon.  Tender if press on it. If does his nasal rinse once home from work says it will feel some better. No ST or fever or chills or sweats.  Has been taking zyrtec daily. He thinks it could be allergies. No hearing changes.   F/U depression - On fluoxetine 40mg  and dong well.  Using his clonazpam once a day at beditme. Uses to to help him fall alseep.   Review of Systems  BP 121/75  Pulse 66  Ht 6' (1.829 m)  Wt 229 lb (103.874 kg)  BMI 31.05 kg/m2    Allergies  Allergen Reactions  . Meperidine Hcl   . Morphine And Related     Past Medical History  Diagnosis Date  . Kidney stone   . Perforated bowel 10/2012    Past Surgical History  Procedure Laterality Date  . Pleural scarification    . Partial colectomy  10/2012    Dr. Yetta Vaughan, perforated bowel    History   Social History  . Marital Status: Divorced    Spouse Name: N/A    Number of Children: N/A  . Years of Education: N/A   Occupational History  . Not on file.   Social History Main Topics  . Smoking status: Never Smoker   . Smokeless tobacco: Not on file  . Alcohol Use: No  . Drug Use: No  . Sexual Activity: Not on file   Other Topics Concern  . Not on file   Social History Narrative  . No narrative on file    No family history on file.  Outpatient Encounter Prescriptions as of 10/21/2013  Medication Sig  . Cetirizine HCl (ZYRTEC PO) Take by mouth.  . clonazePAM (KLONOPIN) 0.25 MG disintegrating tablet DISSOLVE 1 TABLET ON THE TONGUE TWICE DAILY AS NEEDED FOR  ANXIETY  . FLUoxetine (PROZAC) 40 MG capsule TAKE 1 CAPSULE BY MOUTH EVERY DAY  . tamsulosin (FLOMAX) 0.4 MG CAPS capsule TAKE 1 CAPSULE BY MOUTH EVERY DAY  . acetic acid (VOSOL) 2 % otic solution Place 4 drops into the right ear 3 (three) times daily as needed.          Objective:   Physical Exam  Constitutional: He is oriented to person, place, and time. He appears well-developed and well-nourished.  HENT:  Head: Normocephalic and atraumatic.  Right Ear: External ear normal.  Left Ear: External ear normal.  Nose: Nose normal.  Mouth/Throat: Oropharynx is clear and moist.  TMs and canals are clear.   Eyes: Conjunctivae and EOM are normal. Pupils are equal, round, and reactive to light.  Neck: Neck supple. No thyromegaly present.  Cardiovascular: Normal rate and normal heart sounds.   Pulmonary/Chest: Effort normal and breath sounds normal.  Abdominal: Soft. Bowel sounds are normal. He exhibits no distension.  Incision well healed.   Lymphadenopathy:    He has no cervical adenopathy.  Neurological: He is alert and oriented to person, place, and time.  Skin: Skin is warm  and dry.  Psychiatric: He has a normal mood and affect.          Assessment & Plan:  S/P cholecystectomy - Healing well and diet is back to normal. Continue to watch his diet and a four-way high fat greasy foods as this can sometimes cause a dumping syndrome.  Asthma - Well controlled.  Just encourage him to make sure that he still has a rescue inhaler available in case he starts to have symptoms.  Depression - well controlled on current regimen.  Continue with fluoxetine 40 mg. He never increased the dose but feels like he is doing well on this. We recently refilled his clonazepam. Followup in 4 months.  Right ear pain - suspect related ot allergies as his ear exam is actually normal. Continue Zyrtec and Advair salt drops and see if this provides relief. If suddenly gets worse, the pain is persistent and  lasting all day, or spikes a fever, or feels like there's actual hearing changes then please let us know.Marland Kitchen

## 2013-11-05 ENCOUNTER — Telehealth: Payer: Self-pay | Admitting: Family Medicine

## 2013-11-05 MED ORDER — EPINEPHRINE 0.3 MG/0.3ML IJ SOAJ: 0.3000 mg | Freq: Once | INTRAMUSCULAR | Status: DC

## 2013-11-05 NOTE — Telephone Encounter (Signed)
Patient has bee allergy and epinephrine pen is up-to-date. Would like a prescription sent to his pharmacy per his mother's request today.

## 2013-11-12 ENCOUNTER — Other Ambulatory Visit: Payer: Self-pay | Admitting: *Deleted

## 2013-11-12 ENCOUNTER — Other Ambulatory Visit: Payer: Self-pay | Admitting: Sports Medicine

## 2013-11-12 MED ORDER — CLONAZEPAM 0.25 MG PO TBDP: ORAL_TABLET | ORAL | Status: DC

## 2013-11-12 MED ORDER — FLUOXETINE HCL 40 MG PO CAPS: ORAL_CAPSULE | ORAL | Status: DC

## 2013-11-18 ENCOUNTER — Other Ambulatory Visit: Payer: Self-pay | Admitting: Family Medicine

## 2013-12-09 ENCOUNTER — Other Ambulatory Visit: Payer: Self-pay | Admitting: Family Medicine

## 2013-12-16 ENCOUNTER — Other Ambulatory Visit: Payer: Self-pay | Admitting: Family Medicine

## 2014-01-07 ENCOUNTER — Other Ambulatory Visit: Payer: Self-pay | Admitting: Family Medicine

## 2014-01-18 ENCOUNTER — Other Ambulatory Visit: Payer: Self-pay | Admitting: Family Medicine

## 2014-02-08 ENCOUNTER — Other Ambulatory Visit: Payer: Self-pay | Admitting: Family Medicine

## 2014-02-13 ENCOUNTER — Other Ambulatory Visit: Payer: Self-pay | Admitting: Family Medicine

## 2014-02-16 ENCOUNTER — Other Ambulatory Visit: Payer: Self-pay | Admitting: Family Medicine

## 2014-02-21 ENCOUNTER — Ambulatory Visit: Payer: BC Managed Care – PPO | Admitting: Family Medicine

## 2014-03-10 ENCOUNTER — Other Ambulatory Visit: Payer: Self-pay | Admitting: Family Medicine

## 2014-03-15 ENCOUNTER — Other Ambulatory Visit: Payer: Self-pay | Admitting: Family Medicine

## 2014-03-21 ENCOUNTER — Other Ambulatory Visit: Payer: Self-pay | Admitting: Family Medicine

## 2014-04-07 ENCOUNTER — Other Ambulatory Visit: Payer: Self-pay | Admitting: Family Medicine

## 2014-04-12 ENCOUNTER — Other Ambulatory Visit: Payer: Self-pay | Admitting: Family Medicine

## 2014-04-17 ENCOUNTER — Other Ambulatory Visit: Payer: Self-pay | Admitting: Family Medicine

## 2014-05-02 ENCOUNTER — Ambulatory Visit (INDEPENDENT_AMBULATORY_CARE_PROVIDER_SITE_OTHER): Payer: BC Managed Care – PPO | Admitting: Family Medicine

## 2014-05-02 ENCOUNTER — Encounter: Payer: Self-pay | Admitting: Family Medicine

## 2014-05-02 VITALS — BP 118/75 | HR 88 | Temp 97.0°F | Wt 231.0 lb

## 2014-05-02 DIAGNOSIS — J069 Acute upper respiratory infection, unspecified: Secondary | ICD-10-CM

## 2014-05-02 DIAGNOSIS — K439 Ventral hernia without obstruction or gangrene: Secondary | ICD-10-CM | POA: Diagnosis not present

## 2014-05-02 DIAGNOSIS — R195 Other fecal abnormalities: Secondary | ICD-10-CM | POA: Diagnosis not present

## 2014-05-02 MED ORDER — FLUOXETINE HCL 40 MG PO CAPS: ORAL_CAPSULE | ORAL | Status: DC

## 2014-05-02 MED ORDER — CLONAZEPAM 0.25 MG PO TBDP: ORAL_TABLET | ORAL | Status: DC

## 2014-05-02 MED ORDER — TAMSULOSIN HCL 0.4 MG PO CAPS: ORAL_CAPSULE | ORAL | Status: DC

## 2014-05-02 MED ORDER — BENZONATATE 200 MG PO CAPS: 200.0000 mg | ORAL_CAPSULE | Freq: Three times a day (TID) | ORAL | Status: DC | PRN

## 2014-05-02 MED ORDER — HYDROCODONE-HOMATROPINE 5-1.5 MG/5ML PO SYRP: 5.0000 mL | ORAL_SOLUTION | Freq: Every evening | ORAL | Status: DC | PRN

## 2014-05-02 NOTE — Progress Notes (Signed)
   Subjective:    Patient ID: Terry DoppFrederick J Vaughan, male    DOB: 1960/08/08, 53 y.o.   MRN: [id number]  HPI 3 days of sinus congestin. When coughs or sneezes notices a know in his stomach that is the size of his fist. Says it gurgles when he  Presses on it.  If it is very tender. In fact he's actually been taking his old rib belt and wearing it around the hernia for support. The coughing is aggravating his symptoms. He denies any ear pain or headache or sore throat. He still doing his sinus rinses, which he does on a routine basis.  He's also been having persistent watery diarrhea for almost 3 weeks. He denies any blood in the stool. No fevers or chills.  Review of Systems     Objective:   Physical Exam  Constitutional: He is oriented to person, place, and time. He appears well-developed and well-nourished.  HENT:  Head: Normocephalic and atraumatic.  Right Ear: External ear normal.  Left Ear: External ear normal.  Nose: Nose normal.  Mouth/Throat: Oropharynx is clear and moist.  TMs and canals are clear.   Eyes: Conjunctivae and EOM are normal. Pupils are equal, round, and reactive to light.  Neck: Neck supple. No thyromegaly present.  Cardiovascular: Normal rate and normal heart sounds.   Pulmonary/Chest: Effort normal and breath sounds normal.  Abdominal: Soft. Bowel sounds are normal. He exhibits no distension. There is tenderness. There is no rebound and no guarding.  approx 4 cm round bulging to the right of his longitudinal scar above the umbilicus.  There is definitely a wall defect there area I'm able to palpate a smaller approximately 2 cm area in the center that is more thin.  Lymphadenopathy:    He has no cervical adenopathy.  Neurological: He is alert and oriented to person, place, and time.  Skin: Skin is warm and dry.  Psychiatric: He has a normal mood and affect.          Assessment & Plan:  URI - likely viral. Easily been sick for 3 days at this point. We will  focus on trying to get his cough under better control since it is aggravating the hernia. Will give him Tessalon Perles to use during the day and can use the cough medicine in the evening once he is home. Warned that he cannot drive with the cough syrup as it can be sedating.  Abdominal wall hernia- explained is not uncommon to have a hernia form along the wall the surgical scar. Will refer him back to Dr. Phill MyronJennifer Christmann. Will call today to see if we can get him an appointment early this week since he is having a fair amount of tenderness and discomfort. The hernia is reducible.  Watery stools-will collect specimen for stool culture. Will check CBC with differential. Can also discuss with his surgeon when he sees her for his abdominal wall hernia.

## 2014-05-03 DIAGNOSIS — K432 Incisional hernia without obstruction or gangrene: Secondary | ICD-10-CM | POA: Insufficient documentation

## 2014-05-03 LAB — CBC WITH DIFFERENTIAL/PLATELET
Basophils Absolute: 0 10*3/uL (ref 0.0–0.1)
Basophils Relative: 0 % (ref 0–1)
EOS PCT: 1 % (ref 0–5)
Eosinophils Absolute: 0.1 10*3/uL (ref 0.0–0.7)
HCT: 44.7 % (ref 39.0–52.0)
HEMOGLOBIN: 15 g/dL (ref 13.0–17.0)
LYMPHS ABS: 1.3 10*3/uL (ref 0.7–4.0)
LYMPHS PCT: 14 % (ref 12–46)
MCH: 30.5 pg (ref 26.0–34.0)
MCHC: 33.6 g/dL (ref 30.0–36.0)
MCV: 90.9 fL (ref 78.0–100.0)
MPV: 11.2 fL (ref 9.4–12.4)
Monocytes Absolute: 1.1 10*3/uL — ABNORMAL HIGH (ref 0.1–1.0)
Monocytes Relative: 12 % (ref 3–12)
NEUTROS ABS: 6.6 10*3/uL (ref 1.7–7.7)
Neutrophils Relative %: 73 % (ref 43–77)
Platelets: 238 10*3/uL (ref 150–400)
RBC: 4.92 MIL/uL (ref 4.22–5.81)
RDW: 13.5 % (ref 11.5–15.5)
WBC: 9.1 10*3/uL (ref 4.0–10.5)

## 2014-05-16 DIAGNOSIS — K56609 Unspecified intestinal obstruction, unspecified as to partial versus complete obstruction: Secondary | ICD-10-CM | POA: Insufficient documentation

## 2014-06-06 ENCOUNTER — Other Ambulatory Visit: Payer: Self-pay | Admitting: Family Medicine

## 2014-06-07 DIAGNOSIS — Z9889 Other specified postprocedural states: Secondary | ICD-10-CM | POA: Insufficient documentation

## 2014-06-15 ENCOUNTER — Encounter: Payer: Self-pay | Admitting: Physician Assistant

## 2014-06-15 ENCOUNTER — Ambulatory Visit (INDEPENDENT_AMBULATORY_CARE_PROVIDER_SITE_OTHER): Payer: BLUE CROSS/BLUE SHIELD | Admitting: Physician Assistant

## 2014-06-15 VITALS — BP 125/71 | HR 64 | Temp 98.0°F | Wt 225.0 lb

## 2014-06-15 DIAGNOSIS — N3001 Acute cystitis with hematuria: Secondary | ICD-10-CM | POA: Diagnosis not present

## 2014-06-15 DIAGNOSIS — R350 Frequency of micturition: Secondary | ICD-10-CM

## 2014-06-15 DIAGNOSIS — R300 Dysuria: Secondary | ICD-10-CM | POA: Diagnosis not present

## 2014-06-15 LAB — POCT URINALYSIS DIPSTICK
Bilirubin, UA: NEGATIVE
GLUCOSE UA: NEGATIVE
Nitrite, UA: NEGATIVE
PH UA: 5.5
Protein, UA: 30
Spec Grav, UA: 1.025
Urobilinogen, UA: 1

## 2014-06-15 MED ORDER — SULFAMETHOXAZOLE-TRIMETHOPRIM 800-160 MG PO TABS: 1.0000 | ORAL_TABLET | Freq: Two times a day (BID) | ORAL | Status: DC

## 2014-06-15 NOTE — Patient Instructions (Signed)

## 2014-06-15 NOTE — Progress Notes (Signed)
   Subjective:    Patient ID: Terry DoppFrederick J Vaughan, male    DOB: [birth year], 54 y.o.   MRN: [id number]  HPI  Pt presents to the clinic with 3 days of burning urination and some pain in left lower quadrant. He had hernia surgery at end of December. He was treated with cipro for bladder infection likely from catherization. He had to remain catherized until about 2 weeks ago. Symptoms have started to return. No fever, chills, nausea or vomiting. Still packing incision and still draining.      Review of Systems  All other systems reviewed and are negative.      Objective:   Physical Exam  Constitutional: He is oriented to person, place, and time. He appears well-developed and well-nourished.  HENT:  Head: Normocephalic and atraumatic.  Cardiovascular: Normal rate, regular rhythm and normal heart sounds.   Pulmonary/Chest: Effort normal and breath sounds normal.  NO CVA tenderness.   Abdominal: Soft.  Healing umbilical incision.  No guarding or rebound.  Points to pain over left lower quadrant but no increased pain to palpation.   Neurological: He is alert and oriented to person, place, and time.  Psychiatric: He has a normal mood and affect. His behavior is normal.          Assessment & Plan:  UTI, male- likely due to recent catheterization. .. Results for orders placed or performed in visit on 06/15/14  POCT urinalysis dipstick  Result Value Ref Range   Color, UA dark yellow    Clarity, UA cloudy    Glucose, UA neg    Bilirubin, UA neg    Ketones, UA trace    Spec Grav, UA 1.025    Blood, UA large    pH, UA 5.5    Protein, UA 30 mg/dL    Urobilinogen, UA 1.0    Nitrite, UA neg    Leukocytes, UA large (3+)    Treated with Bactrim for 14 days. Discussed if not improving in the next 36 hours to call office. Increase hydration. Discuss rest. Bring in sample after finish abx and will run to make sure infection cleared.

## 2014-07-06 ENCOUNTER — Other Ambulatory Visit: Payer: Self-pay | Admitting: Family Medicine

## 2014-08-04 ENCOUNTER — Other Ambulatory Visit: Payer: Self-pay | Admitting: Family Medicine

## 2014-08-04 NOTE — Telephone Encounter (Signed)
Patient needs to see Provider for appointment.

## 2014-08-19 ENCOUNTER — Encounter: Payer: Self-pay | Admitting: Family Medicine

## 2014-08-19 ENCOUNTER — Ambulatory Visit (INDEPENDENT_AMBULATORY_CARE_PROVIDER_SITE_OTHER): Payer: BLUE CROSS/BLUE SHIELD | Admitting: Family Medicine

## 2014-08-19 VITALS — BP 118/65 | HR 68 | Ht 72.0 in | Wt 233.0 lb

## 2014-08-19 DIAGNOSIS — G4700 Insomnia, unspecified: Secondary | ICD-10-CM

## 2014-08-19 DIAGNOSIS — F331 Major depressive disorder, recurrent, moderate: Secondary | ICD-10-CM | POA: Diagnosis not present

## 2014-08-19 DIAGNOSIS — F411 Generalized anxiety disorder: Secondary | ICD-10-CM | POA: Diagnosis not present

## 2014-08-19 DIAGNOSIS — R5383 Other fatigue: Secondary | ICD-10-CM

## 2014-08-19 DIAGNOSIS — Z13220 Encounter for screening for lipoid disorders: Secondary | ICD-10-CM

## 2014-08-19 MED ORDER — FLUOXETINE HCL 20 MG PO TABS: 60.0000 mg | ORAL_TABLET | Freq: Every day | ORAL | Status: DC

## 2014-08-19 MED ORDER — CLONAZEPAM 0.25 MG PO TBDP: ORAL_TABLET | ORAL | Status: DC

## 2014-08-19 NOTE — Progress Notes (Signed)
   Subjective:    Patient ID: Terry Vaughan, male    DOB: [birth year], 54 y.o.   MRN: [id number]  HPI Says can fall asleep but wakes up 4-6 times at night and takes about 20 min to go back to sleep.    Depression/anxiety- he does complain of feeling down several days a week and feeling little interest or pleasure in doing things more than half the days. He has been consistently going to work and has been on time. He also complains of difficulty concentrating. Those nervous and on edge more than half the days and has difficulty sleeping because of excess worrying nearly every night. It's hard for him to relax and constantly feels feels fearful that something is going to happen especially in regards to his colectomy. He says the fluoxetine has been amazing and has been helpful. It has really gotten through this hard year in his life.  Has been using fiber tabs, 5 per day to keep his bowels going quit drinking soda.  This has been working to keep his bowels moving.  His stomach has been making more noise and he is concerned.    S/P colectomy - Taking extra fiber daily to help move his bowels.   He also complains of feeling extremely fatigued. He said he actually asked the surgeon about it and she said it may take some time for him to bounce back from everything. Review of Systems     Objective:   Physical Exam  Constitutional: He is oriented to person, place, and time. He appears well-developed and well-nourished.  HENT:  Head: Normocephalic and atraumatic.  Cardiovascular: Normal rate, regular rhythm and normal heart sounds.   Pulmonary/Chest: Effort normal and breath sounds normal.  Neurological: He is alert and oriented to person, place, and time.  Skin: Skin is warm and dry.  Psychiatric: He has a normal mood and affect. His behavior is normal.          Assessment & Plan:  Insomnia - discussed that the anxiety is really driving his anxiety. Let's work on the anxiety first.   Already takes the clonazepam at bedtime.    GAD- Increase fluoxetine 60mg  and f/u 6 weeks. Gad 7 score of 14 today. He rates his symptoms is somewhat difficult. PHQ 9 score of 10 today. We will tolerate well.  Status post colectomy-he's been taking 5 fiber tabs a day and this has been keeping his bowels moving. He has noticed some increase GI noises but no pain nausea or vomiting. He is evidently very concerned about, so encouraged him to speak with Dr. Yetta Numbershrisman about it. He's not having any heartburn or reflux and does take pantoprazole.  Fatigue-we'll check for anemia and deficiencies. Certainly with the colectomy etc. there could be some element of malabsorption.  Due for screening lipid panel.

## 2014-08-24 ENCOUNTER — Other Ambulatory Visit: Payer: Self-pay | Admitting: Family Medicine

## 2014-08-24 NOTE — Telephone Encounter (Signed)
Pharmacy called to get directions and quantity for Rx. Please advise.

## 2014-08-25 NOTE — Telephone Encounter (Signed)
Riley ChurchesKelsi, I don't see that Dr. Judie PetitM has ever prescribed this medication for this patient.  Can you please ask the pharmacy to see if this was actually prescribed by another physician and if not forward back to me or Dr. Judie PetitM.

## 2014-08-29 MED ORDER — PANTOPRAZOLE SODIUM 40 MG PO TBEC: 40.0000 mg | DELAYED_RELEASE_TABLET | Freq: Every day | ORAL | Status: DC

## 2014-08-29 NOTE — Telephone Encounter (Signed)
Refill is appropriate however I would advise not taking it for at least a week to see if it is still needed. Symptoms that would suggest he needs it include abdominal pain, reflux, or indigestion.  Rx setn to [facility] in [place]

## 2014-08-29 NOTE — Telephone Encounter (Signed)
Spoke with Pt's mother, who was able to read me the directions from the Rx bottle. Pt was written when Pt had his gallbladder removed by Dr. Concepcion Elkhristopher Jue. Rx directions read: take 1 tablet (40mg ) PO daily 30 minutes before eating.  Please advise is refill is appropriate.

## 2014-08-30 ENCOUNTER — Other Ambulatory Visit: Payer: Self-pay | Admitting: Family Medicine

## 2014-08-30 MED ORDER — PANTOPRAZOLE SODIUM 40 MG PO TBEC
40.0000 mg | DELAYED_RELEASE_TABLET | Freq: Every day | ORAL | Status: DC
Start: 2014-08-30 — End: 2015-02-09

## 2014-08-30 NOTE — Telephone Encounter (Signed)
Had to discontinue and reorder Rx, as Pt called this am stating per insurance he had to change pharmacies.

## 2014-09-30 ENCOUNTER — Ambulatory Visit: Payer: BLUE CROSS/BLUE SHIELD | Admitting: Family Medicine

## 2014-10-12 ENCOUNTER — Ambulatory Visit: Payer: BLUE CROSS/BLUE SHIELD | Admitting: Family Medicine

## 2014-11-04 ENCOUNTER — Other Ambulatory Visit: Payer: Self-pay | Admitting: Family Medicine

## 2014-11-19 LAB — CBC WITH DIFFERENTIAL/PLATELET
Basophils Absolute: 0.1 10*3/uL (ref 0.0–0.1)
Basophils Relative: 1 % (ref 0–1)
EOS ABS: 0.2 10*3/uL (ref 0.0–0.7)
EOS PCT: 2 % (ref 0–5)
HEMATOCRIT: 46.4 % (ref 39.0–52.0)
Hemoglobin: 15.5 g/dL (ref 13.0–17.0)
LYMPHS ABS: 1.7 10*3/uL (ref 0.7–4.0)
LYMPHS PCT: 22 % (ref 12–46)
MCH: 30.6 pg (ref 26.0–34.0)
MCHC: 33.4 g/dL (ref 30.0–36.0)
MCV: 91.7 fL (ref 78.0–100.0)
MONO ABS: 0.6 10*3/uL (ref 0.1–1.0)
MPV: 11.4 fL (ref 8.6–12.4)
Monocytes Relative: 8 % (ref 3–12)
Neutro Abs: 5 10*3/uL (ref 1.7–7.7)
Neutrophils Relative %: 67 % (ref 43–77)
PLATELETS: 256 10*3/uL (ref 150–400)
RBC: 5.06 MIL/uL (ref 4.22–5.81)
RDW: 14.1 % (ref 11.5–15.5)
WBC: 7.5 10*3/uL (ref 4.0–10.5)

## 2014-11-19 LAB — FERRITIN: Ferritin: 113 ng/mL (ref 22–322)

## 2014-11-19 LAB — COMPLETE METABOLIC PANEL WITH GFR
ALBUMIN: 4.1 g/dL (ref 3.5–5.2)
ALK PHOS: 78 U/L (ref 39–117)
ALT: 16 U/L (ref 0–53)
AST: 15 U/L (ref 0–37)
BUN: 15 mg/dL (ref 6–23)
CHLORIDE: 106 meq/L (ref 96–112)
CO2: 26 mEq/L (ref 19–32)
Calcium: 9.1 mg/dL (ref 8.4–10.5)
Creat: 1.27 mg/dL (ref 0.50–1.35)
GFR, EST NON AFRICAN AMERICAN: 64 mL/min
GFR, Est African American: 74 mL/min
GLUCOSE: 81 mg/dL (ref 70–99)
Potassium: 5.1 mEq/L (ref 3.5–5.3)
SODIUM: 145 meq/L (ref 135–145)
Total Bilirubin: 1 mg/dL (ref 0.2–1.2)
Total Protein: 6.7 g/dL (ref 6.0–8.3)

## 2014-11-19 LAB — LIPID PANEL
CHOL/HDL RATIO: 4.9 ratio
CHOLESTEROL: 199 mg/dL (ref 0–200)
HDL: 41 mg/dL (ref >=40)
LDL CALC: 138 mg/dL — AB (ref 0–99)
Triglycerides: 102 mg/dL (ref <150)
VLDL: 20 mg/dL (ref 0–40)

## 2014-11-19 LAB — IRON: IRON: 77 ug/dL (ref 42–165)

## 2014-11-19 LAB — VITAMIN B12: Vitamin B-12: 309 pg/mL (ref 211–911)

## 2014-11-19 LAB — MAGNESIUM: MAGNESIUM: 1.9 mg/dL (ref 1.5–2.5)

## 2014-12-02 ENCOUNTER — Other Ambulatory Visit: Payer: Self-pay | Admitting: Family Medicine

## 2014-12-30 ENCOUNTER — Other Ambulatory Visit: Payer: Self-pay | Admitting: Family Medicine

## 2015-01-30 ENCOUNTER — Other Ambulatory Visit: Payer: Self-pay | Admitting: Family Medicine

## 2015-01-31 NOTE — Telephone Encounter (Signed)
Pt has upcoming appt scheduled, filled a 15 day supply to last until appt.

## 2015-02-02 ENCOUNTER — Ambulatory Visit: Payer: BLUE CROSS/BLUE SHIELD | Admitting: Family Medicine

## 2015-02-09 ENCOUNTER — Ambulatory Visit (INDEPENDENT_AMBULATORY_CARE_PROVIDER_SITE_OTHER): Payer: BLUE CROSS/BLUE SHIELD | Admitting: Family Medicine

## 2015-02-09 ENCOUNTER — Other Ambulatory Visit: Payer: Self-pay | Admitting: *Deleted

## 2015-02-09 ENCOUNTER — Encounter: Payer: Self-pay | Admitting: Family Medicine

## 2015-02-09 VITALS — BP 117/65 | HR 63 | Ht 72.0 in | Wt 239.0 lb

## 2015-02-09 DIAGNOSIS — K219 Gastro-esophageal reflux disease without esophagitis: Secondary | ICD-10-CM

## 2015-02-09 DIAGNOSIS — Z23 Encounter for immunization: Secondary | ICD-10-CM | POA: Diagnosis not present

## 2015-02-09 DIAGNOSIS — F331 Major depressive disorder, recurrent, moderate: Secondary | ICD-10-CM

## 2015-02-09 DIAGNOSIS — F411 Generalized anxiety disorder: Secondary | ICD-10-CM | POA: Diagnosis not present

## 2015-02-09 DIAGNOSIS — R1084 Generalized abdominal pain: Secondary | ICD-10-CM

## 2015-02-09 DIAGNOSIS — Z114 Encounter for screening for human immunodeficiency virus [HIV]: Secondary | ICD-10-CM | POA: Diagnosis not present

## 2015-02-09 DIAGNOSIS — Z1159 Encounter for screening for other viral diseases: Secondary | ICD-10-CM

## 2015-02-09 DIAGNOSIS — Z13220 Encounter for screening for lipoid disorders: Secondary | ICD-10-CM

## 2015-02-09 DIAGNOSIS — N400 Enlarged prostate without lower urinary tract symptoms: Secondary | ICD-10-CM

## 2015-02-09 LAB — COMPLETE METABOLIC PANEL WITH GFR
ALBUMIN: 4.1 g/dL (ref 3.6–5.1)
ALK PHOS: 73 U/L (ref 40–115)
ALT: 19 U/L (ref 9–46)
AST: 19 U/L (ref 10–35)
BILIRUBIN TOTAL: 1.3 mg/dL — AB (ref 0.2–1.2)
BUN: 18 mg/dL (ref 7–25)
CO2: 24 mmol/L (ref 20–31)
Calcium: 9.2 mg/dL (ref 8.6–10.3)
Chloride: 106 mmol/L (ref 98–110)
Creat: 1.26 mg/dL (ref 0.70–1.33)
GFR, EST NON AFRICAN AMERICAN: 64 mL/min (ref >=60)
GFR, Est African American: 74 mL/min (ref >=60)
Glucose, Bld: 79 mg/dL (ref 65–99)
Potassium: 4.3 mmol/L (ref 3.5–5.3)
Sodium: 141 mmol/L (ref 135–146)
TOTAL PROTEIN: 6.6 g/dL (ref 6.1–8.1)

## 2015-02-09 LAB — LIPID PANEL
Cholesterol: 181 mg/dL (ref 125–200)
HDL: 35 mg/dL — ABNORMAL LOW (ref >=40)
LDL CALC: 115 mg/dL (ref <130)
TRIGLYCERIDES: 157 mg/dL — AB (ref <150)
Total CHOL/HDL Ratio: 5.2 Ratio — ABNORMAL HIGH (ref <=5.0)
VLDL: 31 mg/dL — ABNORMAL HIGH (ref <30)

## 2015-02-09 MED ORDER — TAMSULOSIN HCL 0.4 MG PO CAPS: ORAL_CAPSULE | ORAL | Status: DC

## 2015-02-09 MED ORDER — FLUOXETINE HCL 20 MG PO TABS: 60.0000 mg | ORAL_TABLET | Freq: Every day | ORAL | Status: DC

## 2015-02-09 MED ORDER — PANTOPRAZOLE SODIUM 40 MG PO TBEC: 40.0000 mg | DELAYED_RELEASE_TABLET | Freq: Every day | ORAL | Status: DC

## 2015-02-09 MED ORDER — CLONAZEPAM 0.25 MG PO TBDP: ORAL_TABLET | ORAL | Status: DC

## 2015-02-09 NOTE — Progress Notes (Signed)
   Subjective:    Patient ID: Terry Vaughan, male    DOB: 1960-08-01, 54 y.o.   MRN: [id number]  HPI F/U GAD/Depression - he is on prozac   and doing well.   Says he is frustrated with his bowels after his colectomy. Says this is really distressing to him at his job.  BPH - needs new rx for his flomax.  Days notices a big difference when he doesn't take it.    GERD - taking protonix every morning.  Says his heartburn has been under good control.   Still having a lot of problems with changes in his bowels. He flip-flops between constipation and diarrhea and has a lot of abdominal pain after he eats. This is after his colectomy. I asked him if he is followed back up with GI and he says he doesn't want to him unless he is going there in ambulance.   Review of Systems     Objective:   Physical Exam  Constitutional: He is oriented to person, place, and time. He appears well-developed and well-nourished.  HENT:  Head: Normocephalic and atraumatic.  Cardiovascular: Normal rate, regular rhythm and normal heart sounds.   Pulmonary/Chest: Effort normal and breath sounds normal.  Neurological: He is alert and oriented to person, place, and time.  Skin: Skin is warm and dry.  Psychiatric: He has a normal mood and affect. His behavior is normal.          Assessment & Plan:  GAD/Depression -   PHQ- 9 score of 13.  GAD- 7 score of 16.    BPH - doing well on Flomax. Refills sent.    GERD - encouraged him to decrease to every day. [place] dry generally decrease to every third day of tolerating this well.  Abdominal pain status post colectomy-did encourage him to consider going back and having a consult with GI.

## 2015-02-10 LAB — PSA: PSA: 1.32 ng/mL (ref <=4.00)

## 2015-02-10 LAB — HIV ANTIBODY (ROUTINE TESTING W REFLEX): HIV 1&2 Ab, 4th Generation: NONREACTIVE

## 2015-02-10 LAB — HEPATITIS C ANTIBODY: HCV Ab: NEGATIVE

## 2015-02-27 ENCOUNTER — Other Ambulatory Visit: Payer: Self-pay | Admitting: *Deleted

## 2015-02-27 MED ORDER — EPINEPHRINE 0.3 MG/0.3ML IJ SOAJ: 0.3000 mg | Freq: Once | INTRAMUSCULAR | Status: DC

## 2015-04-09 ENCOUNTER — Other Ambulatory Visit: Payer: Self-pay | Admitting: Family Medicine

## 2015-05-09 ENCOUNTER — Telehealth: Payer: Self-pay

## 2015-05-09 NOTE — Telephone Encounter (Signed)
[facility]

## 2015-05-09 NOTE — Telephone Encounter (Signed)
Patients insurance has changed, and prescriptions need to be changed back to [facility].  He is in need of Prozac, and Klonopin in by end of the week.

## 2015-05-10 ENCOUNTER — Other Ambulatory Visit: Payer: Self-pay

## 2015-05-10 MED ORDER — CLONAZEPAM 0.25 MG PO TBDP: 0.2500 mg | ORAL_TABLET | Freq: Two times a day (BID) | ORAL | Status: DC | PRN

## 2015-05-10 MED ORDER — FLUOXETINE HCL 20 MG PO TABS: 60.0000 mg | ORAL_TABLET | Freq: Every day | ORAL | Status: DC

## 2015-05-10 NOTE — Telephone Encounter (Signed)
Faxed to Pharmacy

## 2015-05-23 ENCOUNTER — Other Ambulatory Visit: Payer: Self-pay | Admitting: Family Medicine

## 2015-06-08 ENCOUNTER — Other Ambulatory Visit: Payer: Self-pay | Admitting: Family Medicine

## 2015-06-08 MED ORDER — CLONAZEPAM 0.25 MG PO TBDP: 0.2500 mg | ORAL_TABLET | Freq: Two times a day (BID) | ORAL | Status: DC | PRN

## 2015-06-26 ENCOUNTER — Other Ambulatory Visit: Payer: Self-pay | Admitting: Family Medicine

## 2015-07-06 ENCOUNTER — Other Ambulatory Visit: Payer: Self-pay | Admitting: Family Medicine

## 2015-07-21 ENCOUNTER — Other Ambulatory Visit: Payer: Self-pay | Admitting: Family Medicine

## 2015-07-22 ENCOUNTER — Other Ambulatory Visit: Payer: Self-pay | Admitting: Family Medicine

## 2015-07-25 ENCOUNTER — Telehealth: Payer: Self-pay | Admitting: Family Medicine

## 2015-07-25 NOTE — Telephone Encounter (Signed)
I called pt and left a message to let him know he needs to call and schedule a f/u appt with pcp

## 2015-08-17 ENCOUNTER — Other Ambulatory Visit: Payer: Self-pay | Admitting: Family Medicine

## 2015-08-22 ENCOUNTER — Other Ambulatory Visit: Payer: Self-pay | Admitting: Family Medicine

## 2015-08-29 ENCOUNTER — Telehealth: Payer: Self-pay | Admitting: Family Medicine

## 2015-08-29 ENCOUNTER — Ambulatory Visit (INDEPENDENT_AMBULATORY_CARE_PROVIDER_SITE_OTHER): Payer: 59 | Admitting: Family Medicine

## 2015-08-29 ENCOUNTER — Encounter: Payer: Self-pay | Admitting: Family Medicine

## 2015-08-29 VITALS — BP 120/61 | HR 65 | Ht 72.0 in | Wt 235.0 lb

## 2015-08-29 DIAGNOSIS — Z23 Encounter for immunization: Secondary | ICD-10-CM | POA: Diagnosis not present

## 2015-08-29 DIAGNOSIS — F331 Major depressive disorder, recurrent, moderate: Secondary | ICD-10-CM

## 2015-08-29 DIAGNOSIS — R5383 Other fatigue: Secondary | ICD-10-CM | POA: Diagnosis not present

## 2015-08-29 DIAGNOSIS — N400 Enlarged prostate without lower urinary tract symptoms: Secondary | ICD-10-CM

## 2015-08-29 DIAGNOSIS — R7989 Other specified abnormal findings of blood chemistry: Secondary | ICD-10-CM

## 2015-08-29 DIAGNOSIS — R748 Abnormal levels of other serum enzymes: Secondary | ICD-10-CM

## 2015-08-29 LAB — HEMOGLOBIN A1C
HEMOGLOBIN A1C: 5.3 % (ref <5.7)
Mean Plasma Glucose: 105 mg/dL

## 2015-08-29 LAB — COMPLETE METABOLIC PANEL WITH GFR
ALBUMIN: 4.2 g/dL (ref 3.6–5.1)
ALT: 15 U/L (ref 9–46)
AST: 17 U/L (ref 10–35)
Alkaline Phosphatase: 76 U/L (ref 40–115)
BUN: 19 mg/dL (ref 7–25)
CHLORIDE: 108 mmol/L (ref 98–110)
CO2: 22 mmol/L (ref 20–31)
CREATININE: 1.39 mg/dL — AB (ref 0.70–1.33)
Calcium: 9 mg/dL (ref 8.6–10.3)
GFR, Est African American: 65 mL/min (ref >=60)
GFR, Est Non African American: 57 mL/min — ABNORMAL LOW (ref >=60)
Glucose, Bld: 80 mg/dL (ref 65–99)
Potassium: 4.3 mmol/L (ref 3.5–5.3)
SODIUM: 139 mmol/L (ref 135–146)
TOTAL PROTEIN: 6.6 g/dL (ref 6.1–8.1)
Total Bilirubin: 0.9 mg/dL (ref 0.2–1.2)

## 2015-08-29 LAB — CBC WITH DIFFERENTIAL/PLATELET
BASOS ABS: 63 {cells}/uL (ref 0–200)
Basophils Relative: 1 %
EOS ABS: 189 {cells}/uL (ref 15–500)
EOS PCT: 3 %
HCT: 44.5 % (ref 38.5–50.0)
HEMOGLOBIN: 15.2 g/dL (ref 13.2–17.1)
LYMPHS ABS: 1449 {cells}/uL (ref 850–3900)
Lymphocytes Relative: 23 %
MCH: 30.8 pg (ref 27.0–33.0)
MCHC: 34.2 g/dL (ref 32.0–36.0)
MCV: 90.3 fL (ref 80.0–100.0)
MPV: 11.4 fL (ref 7.5–12.5)
Monocytes Absolute: 819 cells/uL (ref 200–950)
Monocytes Relative: 13 %
NEUTROS ABS: 3780 {cells}/uL (ref 1500–7800)
Neutrophils Relative %: 60 %
Platelets: 258 10*3/uL (ref 140–400)
RBC: 4.93 MIL/uL (ref 4.20–5.80)
RDW: 14.1 % (ref 11.0–15.0)
WBC: 6.3 10*3/uL (ref 3.8–10.8)

## 2015-08-29 LAB — VITAMIN B12: VITAMIN B 12: 379 pg/mL (ref 200–1100)

## 2015-08-29 LAB — TSH: TSH: 1.3 mIU/L (ref 0.40–4.50)

## 2015-08-29 LAB — FERRITIN: FERRITIN: 80 ng/mL (ref 20–380)

## 2015-08-29 MED ORDER — DUTASTERIDE 0.5 MG PO CAPS: 0.5000 mg | ORAL_CAPSULE | Freq: Every day | ORAL | Status: DC

## 2015-08-29 MED ORDER — CLONAZEPAM 0.25 MG PO TBDP: 0.2500 mg | ORAL_TABLET | Freq: Two times a day (BID) | ORAL | Status: DC | PRN

## 2015-08-29 MED ORDER — CLONAZEPAM 0.25 MG PO TBDP: ORAL_TABLET | ORAL | Status: DC

## 2015-08-29 MED ORDER — TAMSULOSIN HCL 0.4 MG PO CAPS: 0.4000 mg | ORAL_CAPSULE | Freq: Every day | ORAL | Status: DC

## 2015-08-29 NOTE — Telephone Encounter (Signed)
Call patient: Based on his questionnaire that he completed for me regarding his prostate I would like to add another medication to his Flomax called Avodart. It works in a different way to help reduce prostate symptoms. New prescription sent to the pharmacy. Actually like to see him back in about 3 months to see how he is doing.

## 2015-08-29 NOTE — Progress Notes (Signed)
   Subjective:    Patient ID: Terry Vaughan, male    DOB: 01-Feb-1961, 55 y.o.   MRN: [id number]  HPI Follow-up major depressive disorder/anxiety-currently on 60 mg of fluoxetine daily. He feels like overall it is working well but he has been a little bit more stress recently. A good friend of his is not doing well and has some type of disease or cancer. He says that he is also feeling like he has extremely low energy only over the last year to year and a half. He denies any swollen lymph nodes. No swelling of extremities. He has noticed some easy bruising though. He is not on any blood thinners currently. No recent skin or hair changes and no major changes in his weight. He has been working a lot as well.   Follow-up benign prostatic hypertrophy-last PSA was in September and was normal. He's currently on Flomax 0.4 mg capsule daily.    Review of Systems     Objective:   Physical Exam  Constitutional: He is oriented to person, place, and time. He appears well-developed and well-nourished.  HENT:  Head: Normocephalic and atraumatic.  Cardiovascular: Normal rate, regular rhythm and normal heart sounds.   Pulmonary/Chest: Effort normal and breath sounds normal.  Abdominal: Soft. Bowel sounds are normal. He exhibits no distension and no mass. There is no tenderness. There is no rebound and no guarding.  Musculoskeletal: He exhibits no edema.  Neurological: He is alert and oriented to person, place, and time.  Skin: Skin is warm and dry.  Psychiatric: He has a normal mood and affect. His behavior is normal.        Assessment & Plan:  Recurrent depression  disorder/anxiety-GAD 7 score of16 and PHQ 9 score of 13. He rates his symptoms is somewhat difficult. Not well controlled but doesn't like she's been going through some acute stressors recently. Continue with fluoxetine 60 mg daily. Consider therapy/counseling.    BPH-continue Flomax daily. AUA score of 21 today which rates as severe.   Given Advodart to his regimen.    Fatigue-unclear etiology. Will check thyroid as well as a CBC to rule out anemia. He says he really doesn't eat a lot of vegetables and will check for B12 deficiency.

## 2015-08-30 NOTE — Telephone Encounter (Signed)
lvm informing pt of recommendations,. .Heath GoldBarkley, Clayson Riling Lynetta

## 2015-08-31 NOTE — Addendum Note (Signed)
Addended by: Deno EtienneBARKLEY, Mashonda Broski L on: 08/31/2015 09:50 AM   Modules accepted: Orders

## 2015-09-15 ENCOUNTER — Other Ambulatory Visit: Payer: Self-pay | Admitting: Family Medicine

## 2015-09-22 ENCOUNTER — Other Ambulatory Visit: Payer: Self-pay | Admitting: Family Medicine

## 2015-10-02 ENCOUNTER — Other Ambulatory Visit: Payer: Self-pay | Admitting: Family Medicine

## 2015-10-03 ENCOUNTER — Encounter: Payer: Self-pay | Admitting: *Deleted

## 2015-10-19 ENCOUNTER — Other Ambulatory Visit: Payer: Self-pay | Admitting: Family Medicine

## 2015-10-27 ENCOUNTER — Encounter: Payer: Self-pay | Admitting: *Deleted

## 2015-10-27 ENCOUNTER — Telehealth: Payer: Self-pay | Admitting: *Deleted

## 2015-10-27 NOTE — Telephone Encounter (Signed)
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

## 2015-11-03 ENCOUNTER — Other Ambulatory Visit: Payer: Self-pay | Admitting: Family Medicine

## 2015-11-19 ENCOUNTER — Other Ambulatory Visit: Payer: Self-pay | Admitting: Family Medicine

## 2015-12-08 ENCOUNTER — Other Ambulatory Visit: Payer: Self-pay | Admitting: Family Medicine

## 2015-12-19 ENCOUNTER — Other Ambulatory Visit: Payer: Self-pay | Admitting: Family Medicine

## 2016-01-08 ENCOUNTER — Other Ambulatory Visit: Payer: Self-pay | Admitting: Family Medicine

## 2016-02-08 ENCOUNTER — Other Ambulatory Visit: Payer: Self-pay | Admitting: Family Medicine

## 2016-02-28 ENCOUNTER — Encounter: Payer: Self-pay | Admitting: Family Medicine

## 2016-02-28 ENCOUNTER — Ambulatory Visit (INDEPENDENT_AMBULATORY_CARE_PROVIDER_SITE_OTHER): Payer: 59

## 2016-02-28 ENCOUNTER — Ambulatory Visit (INDEPENDENT_AMBULATORY_CARE_PROVIDER_SITE_OTHER): Payer: 59 | Admitting: Family Medicine

## 2016-02-28 VITALS — BP 119/66 | HR 67 | Wt 239.0 lb

## 2016-02-28 DIAGNOSIS — R937 Abnormal findings on diagnostic imaging of other parts of musculoskeletal system: Secondary | ICD-10-CM | POA: Diagnosis not present

## 2016-02-28 DIAGNOSIS — W458XXA Other foreign body or object entering through skin, initial encounter: Secondary | ICD-10-CM

## 2016-02-28 DIAGNOSIS — Z0000 Encounter for general adult medical examination without abnormal findings: Secondary | ICD-10-CM | POA: Diagnosis not present

## 2016-02-28 DIAGNOSIS — F331 Major depressive disorder, recurrent, moderate: Secondary | ICD-10-CM

## 2016-02-28 DIAGNOSIS — G4700 Insomnia, unspecified: Secondary | ICD-10-CM

## 2016-02-28 DIAGNOSIS — M795 Residual foreign body in soft tissue: Secondary | ICD-10-CM | POA: Diagnosis not present

## 2016-02-28 DIAGNOSIS — Z125 Encounter for screening for malignant neoplasm of prostate: Secondary | ICD-10-CM

## 2016-02-28 DIAGNOSIS — Z23 Encounter for immunization: Secondary | ICD-10-CM

## 2016-02-28 NOTE — Patient Instructions (Addendum)
Keep up a regular exercise program and make sure you are eating a healthy diet Try to eat 4 servings of dairy a day, or if you are lactose intolerant take a calcium with vitamin D daily.  Your vaccines are up to date.   Decrease clonazepam to 1.5 tabs for one week and then 1 at bedtime for one week. Then half a tab at betime for  One week and then stop. I will find another sleep aid you can take and still be able to get your CBL.

## 2016-02-28 NOTE — Progress Notes (Signed)
Subjective:    CC: CPD  HPI:  55 year old male comes in today for complete physical exam. He does have a couple concerns he would like to discuss. 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 He currently takes 2 tabs at bedtime to sleep. He does not use it during the day at all.  He also has a foreign body in his right fifth digit on his hand. He thinks it's a piece of fiberglas or splinter. Says was raking and thinks a piece of fiberglass came off the handle.  But is not sure. It's been there for several weeks. He said he was hoping it would come to a head and drain. He did try putting a couple of different salves on it but that hasn't seemed to help.  Past medical history, Surgical history, Family history not pertinant except as noted below, Social history, Allergies, and medications have been entered into the medical record, reviewed, and corrections made.   Review of Systems: No fevers, chills, night sweats, weight loss, chest pain, or shortness of breath.   Objective:    General: Well Developed, well nourished, and in no acute distress.  Neuro: Alert and oriented x3, extra-ocular muscles intact, sensation grossly intact.  HEENT: Normocephalic, atraumatic  Skin: Warm and dry, no rashes. Cardiac: Regular rate and rhythm, no murmurs rubs or gallops, no lower extremity edema.  Respiratory: Clear to auscultation bilaterally. Not using accessory muscles, speaking in full sentences.   Impression and Recommendations:    CPE -  Keep up a regular exercise program and make sure you are eating a healthy diet Try to eat 4 servings of dairy a day, or if you are lactose intolerant take a calcium with vitamin D daily.  Your vaccines are up to date.   Depression-doing really well on his Loxitane 60 mg. He feels like his symptoms are really well controlled and is not interested in weaning this at this time. Due for refills today.  Insomnia-he has been  using his clonazepam 2 tabs at bedtime for sleep. 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 We'll have him decrease down to one half tablet for a week, then down to 1 tab per week, then down to half of a tab at bedtime for week. I will work on trying to find something else I can help him sleep.  Incision and Foreign Body Removal Note  Pre-operative Diagnosis: foreign body  Post-operative Diagnosis: same  Indications: pain, foreign body  Anesthesia: 1% plain lidocaine  Procedure Details  The procedure, risks and complications have been discussed in detail (including, but not limited to airway compromise, infection, bleeding) with the patient, and the patient has signed consent to the procedure.  The skin was sterilely prepped and draped over the affected area in the usual fashion. After adequate local anesthesia, I&D with a #11 blade was performed on the right 5th digit. Purulent drainage: absent. Area probed and 3 mm shard of plastic removed and a smaller 1 mm piece removed.  The patient was observed until stable.  Findings: Fiberglass vs plastic  EBL: 0.5 cc's  Drains: None  Condition: Tolerated procedure well   Complications: none.

## 2016-03-08 ENCOUNTER — Other Ambulatory Visit: Payer: Self-pay | Admitting: Family Medicine

## 2016-03-08 NOTE — Telephone Encounter (Signed)
Ok to fill?  Your note says he is to be weaning off of this.

## 2016-03-18 ENCOUNTER — Other Ambulatory Visit: Payer: Self-pay | Admitting: Family Medicine

## 2016-04-03 LAB — COMPLETE METABOLIC PANEL WITH GFR
ALT: 17 U/L (ref 9–46)
AST: 19 U/L (ref 10–35)
Albumin: 3.9 g/dL (ref 3.6–5.1)
Alkaline Phosphatase: 68 U/L (ref 40–115)
BILIRUBIN TOTAL: 0.9 mg/dL (ref 0.2–1.2)
BUN: 12 mg/dL (ref 7–25)
CHLORIDE: 107 mmol/L (ref 98–110)
CO2: 29 mmol/L (ref 20–31)
CREATININE: 1.38 mg/dL — AB (ref 0.70–1.33)
Calcium: 9.2 mg/dL (ref 8.6–10.3)
GFR, EST AFRICAN AMERICAN: 66 mL/min (ref >=60)
GFR, Est Non African American: 57 mL/min — ABNORMAL LOW (ref >=60)
GLUCOSE: 83 mg/dL (ref 65–99)
Potassium: 4.7 mmol/L (ref 3.5–5.3)
Sodium: 142 mmol/L (ref 135–146)
TOTAL PROTEIN: 6.4 g/dL (ref 6.1–8.1)

## 2016-04-03 LAB — LIPID PANEL
CHOL/HDL RATIO: 5.2 ratio — AB (ref <5.0)
CHOLESTEROL: 193 mg/dL (ref <200)
HDL: 37 mg/dL — ABNORMAL LOW (ref >40)
LDL CALC: 133 mg/dL — AB (ref <100)
Triglycerides: 117 mg/dL (ref <150)
VLDL: 23 mg/dL (ref <30)

## 2016-04-03 LAB — PSA: PSA: 0.8 ng/mL (ref <=4.0)

## 2016-04-08 ENCOUNTER — Other Ambulatory Visit: Payer: Self-pay | Admitting: Family Medicine

## 2016-05-09 ENCOUNTER — Other Ambulatory Visit: Payer: Self-pay | Admitting: Family Medicine

## 2016-06-29 ENCOUNTER — Telehealth: Payer: Self-pay | Admitting: Emergency Medicine

## 2016-06-29 ENCOUNTER — Emergency Department
Admission: EM | Admit: 2016-06-29 | Discharge: 2016-06-29 | Disposition: A | Discharge status: Home / Self Care | Payer: 59 | Source: Home / Self Care | Attending: Family Medicine | Admitting: Family Medicine

## 2016-06-29 ENCOUNTER — Encounter: Payer: Self-pay | Admitting: Emergency Medicine

## 2016-06-29 DIAGNOSIS — R110 Nausea: Secondary | ICD-10-CM | POA: Diagnosis not present

## 2016-06-29 DIAGNOSIS — A09 Infectious gastroenteritis and colitis, unspecified: Secondary | ICD-10-CM | POA: Diagnosis not present

## 2016-06-29 DIAGNOSIS — R197 Diarrhea, unspecified: Secondary | ICD-10-CM

## 2016-06-29 DIAGNOSIS — R6889 Other general symptoms and signs: Secondary | ICD-10-CM

## 2016-06-29 LAB — COMPLETE METABOLIC PANEL WITH GFR
ALT: 18 U/L (ref 9–46)
AST: 26 U/L (ref 10–35)
Albumin: 4.3 g/dL (ref 3.6–5.1)
Alkaline Phosphatase: 89 U/L (ref 40–115)
BUN: 20 mg/dL (ref 7–25)
CO2: 21 mmol/L (ref 20–31)
Calcium: 8.5 mg/dL — ABNORMAL LOW (ref 8.6–10.3)
Chloride: 108 mmol/L (ref 98–110)
Creat: 1.63 mg/dL — ABNORMAL HIGH (ref 0.70–1.33)
GFR, Est African American: 54 mL/min — ABNORMAL LOW (ref >=60)
GFR, Est Non African American: 46 mL/min — ABNORMAL LOW (ref >=60)
Glucose, Bld: 102 mg/dL — ABNORMAL HIGH (ref 65–99)
Potassium: 3.9 mmol/L (ref 3.5–5.3)
Sodium: 138 mmol/L (ref 135–146)
Total Bilirubin: 0.9 mg/dL (ref 0.2–1.2)
Total Protein: 6.8 g/dL (ref 6.1–8.1)

## 2016-06-29 LAB — POCT CBC W AUTO DIFF (K'VILLE URGENT CARE)

## 2016-06-29 MED ORDER — ONDANSETRON 8 MG PO TBDP
8.0000 mg | ORAL_TABLET | Freq: Once | ORAL | Status: AC
  Administered 2016-06-29: 8 mg via ORAL

## 2016-06-29 MED ORDER — OSELTAMIVIR PHOSPHATE 75 MG PO CAPS: 75.0000 mg | ORAL_CAPSULE | Freq: Two times a day (BID) | ORAL | 0 refills | Status: DC

## 2016-06-29 MED ORDER — ACETAMINOPHEN 325 MG PO TABS
650.0000 mg | ORAL_TABLET | Freq: Once | ORAL | Status: AC
  Administered 2016-06-29: 650 mg via ORAL

## 2016-06-29 MED ORDER — SODIUM CHLORIDE 0.9 % IV BOLUS (SEPSIS)
1000.0000 mL | Freq: Once | INTRAVENOUS | Status: AC
  Administered 2016-06-29: 1000 mL via INTRAVENOUS

## 2016-06-29 MED ORDER — ONDANSETRON HCL 4 MG PO TABS: 4.0000 mg | ORAL_TABLET | Freq: Four times a day (QID) | ORAL | 0 refills | Status: DC

## 2016-06-29 NOTE — ED Provider Notes (Signed)
CSN: [id number]     Arrival date & time 06/29/16  0932 History   First MD Initiated Contact with Patient 06/29/16 1018     Chief Complaint  Patient presents with  . Diarrhea   (Consider location/radiation/quality/duration/timing/severity/associated sxs/prior Treatment) HPI  Terry Vaughan is a 56 y.o. male presenting to UC with c/o fever of 100*F yesterday and associated generalized abdominal cramping with multiple episodes of watery diarrhea since last night. Pt believes over 10 episodes since last night. No blood or mucous in the stool.  He has had mild nausea but no vomiting. He was able to keep down a little tea this morning but has not tried to eat anything. He reports body aches, fatigue, chills, and sweats.  He took Aleve but no other OTC medications. No known sick contacts. He cannot think of anything he ate that may have caused symptoms. Pt believes he has the flu. He did get the flu vaccine this year. Mild cough and congestion but no SOB.   Past Medical History:  Diagnosis Date  . Kidney stone   . Perforated bowel (HCC) 10/2012   Past Surgical History:  Procedure Laterality Date  . CHOLECYSTECTOMY  09/2013   Dr. Yetta Numbers, acute cholecystitis  . PARTIAL COLECTOMY  10/2012   Dr. Yetta Numbers, perforated bowel  . PLEURAL SCARIFICATION     History reviewed. No pertinent family history. Social History  Substance Use Topics  . Smoking status: Never Smoker  . Smokeless tobacco: Never Used  . Alcohol use No    Review of Systems  Constitutional: Positive for chills and fatigue. Negative for fever.  HENT: Positive for congestion. Negative for ear pain, sore throat, trouble swallowing and voice change.   Respiratory: Positive for cough. Negative for shortness of breath.   Cardiovascular: Negative for chest pain and palpitations.  Gastrointestinal: Positive for abdominal pain ( mild diffuse cramping), diarrhea and nausea. Negative for vomiting.  Genitourinary: Negative for dysuria,  frequency, hematuria and urgency.  Musculoskeletal: Positive for arthralgias and myalgias. Negative for back pain, neck pain and neck stiffness.  Skin: Negative for rash.  Neurological: Positive for dizziness and light-headedness. Negative for headaches.    Allergies  Meperidine hcl; Morphine and related; and Promethazine hcl  Home Medications   Prior to Admission medications   Medication Sig Start Date End Date Taking? Authorizing Provider  Cetirizine HCl (ZYRTEC PO) Take by mouth.    Historical Provider, MD  clonazePAM (KLONOPIN) 0.25 MG disintegrating tablet DISSOLVE 1 AND 1/2 TABLET BY MOUTH AT NIGHT FOR 1 WEEK, THEN 1 AT BEDTIME FOR 1 WEEK, THEN 1/2 TABLET AT BEDTIME FOR 1 WEEK. THEN STOP 04/08/16   Agapito Games, MD  dutasteride (AVODART) 0.5 MG capsule Take 1 capsule (0.5 mg total) by mouth daily. 08/29/15   Agapito Games, MD  EPINEPHrine (EPIPEN 2-PAK) 0.3 mg/0.3 mL IJ SOAJ injection Inject 0.3 mLs (0.3 mg total) into the muscle once. 02/27/15   Agapito Games, MD  FLUoxetine (PROZAC) 20 MG capsule TAKE 3 CAPSULES BY MOUTH EVERY DAY 09/15/15   Agapito Games, MD  ondansetron (ZOFRAN) 4 MG tablet Take 1 tablet (4 mg total) by mouth every 6 (six) hours. 06/29/16   Junius Finner, PA-C  oseltamivir (TAMIFLU) 75 MG capsule Take 1 capsule (75 mg total) by mouth every 12 (twelve) hours. 06/29/16   Junius Finner, PA-C  pantoprazole (PROTONIX) 40 MG tablet TAKE 1 TABLET BY MOUTH DAILY 12/19/15   Agapito Games, MD  tamsulosin (FLOMAX) 0.4 MG  CAPS capsule TAKE 1 CAPSULE(0.4 MG) BY MOUTH DAILY 03/19/16   Agapito Gamesatherine D Metheney, MD   Meds Ordered and Administered this Visit   Medications  ondansetron (ZOFRAN-ODT) disintegrating tablet 8 mg (8 mg Oral Given 06/29/16 1032)  acetaminophen (TYLENOL) tablet 650 mg (650 mg Oral Given 06/29/16 1030)  sodium chloride 0.9 % bolus 1,000 mL (1,000 mLs Intravenous Given 06/29/16 1045)    BP 99/62 (BP Location: Left Arm)   Pulse  65   Temp 98.3 F (36.8 C) (Oral)   Resp 16   Wt 236 lb (107 kg)   SpO2 99%   BMI 32.01 kg/m  No data found.   Physical Exam  Constitutional: He is oriented to person, place, and time. He appears well-developed and well-nourished. No distress.  Pt appears mildly fatigued and does not appear to feel well. Cooperative and alert during exam.  HENT:  Head: Normocephalic and atraumatic.  Right Ear: Tympanic membrane normal.  Left Ear: Tympanic membrane normal.  Nose: Nose normal.  Mouth/Throat: Uvula is midline, oropharynx is clear and moist and mucous membranes are normal.  Eyes: EOM are normal.  Neck: Normal range of motion. Neck supple.  Cardiovascular: Normal rate and regular rhythm.   Pulmonary/Chest: Effort normal and breath sounds normal. No stridor. No respiratory distress. He has no wheezes. He has no rales.  Abdominal: Soft. He exhibits no distension and no mass. There is no tenderness. There is no rebound and no guarding.  Musculoskeletal: Normal range of motion.  Lymphadenopathy:    He has no cervical adenopathy.  Neurological: He is alert and oriented to person, place, and time.  Skin: Skin is warm. He is diaphoretic ( mild).  Psychiatric: He has a normal mood and affect. His behavior is normal.  Nursing note and vitals reviewed.   Urgent Care Course     Procedures (including critical care time)  Labs Review Labs Reviewed  COMPLETE METABOLIC PANEL WITH GFR  POCT CBC W AUTO DIFF ([facility])    Imaging Review No results found.   MDM   1. Nausea   2. Diarrhea of presumed infectious origin   3. Flu-like symptoms    Pt presenting with flu-like symptoms, most bothersome sx is diarrhea.  1L IV fluids given in UC along with 8mg  ODT zofran and 650mg  PO acetaminophen.   Pt able to keep down several ounces of PO fluids. Pt states he feels much improved and appears much improved after tx in UC. Pt feels comfortable for discharge home.  Discussed  risks/benefits of Tamiflu. Pt would like to try the treatment. Rx: Tamiflu and zofran F/u with PCP in 3-4 days if not improving.      Junius Finnerrin O'Malley, PA-C 06/29/16 1220

## 2016-06-29 NOTE — ED Triage Notes (Signed)
Pt c/o fever of 100 yesterday and diarrhea multiple times in the night. C/o nausea and dizziness but denies vomiting,.

## 2016-06-30 ENCOUNTER — Telehealth (INDEPENDENT_AMBULATORY_CARE_PROVIDER_SITE_OTHER): Payer: 59 | Admitting: Emergency Medicine

## 2016-06-30 ENCOUNTER — Telehealth: Payer: Self-pay | Admitting: Emergency Medicine

## 2016-06-30 DIAGNOSIS — R110 Nausea: Secondary | ICD-10-CM | POA: Diagnosis not present

## 2016-06-30 NOTE — Telephone Encounter (Signed)
Pt called stating that he feels much better.  Pt has been able to eat today and drink Gatorade w/o anything nausea or vomitting.  Pt informed of his lab results and will f/u with his PCP.  TMartin,CMA

## 2016-07-19 ENCOUNTER — Other Ambulatory Visit: Payer: Self-pay | Admitting: Family Medicine

## 2016-07-22 ENCOUNTER — Other Ambulatory Visit: Payer: Self-pay

## 2016-07-22 MED ORDER — PANTOPRAZOLE SODIUM 40 MG PO TBEC: 40.0000 mg | DELAYED_RELEASE_TABLET | Freq: Every day | ORAL | 6 refills | Status: DC

## 2016-08-13 ENCOUNTER — Other Ambulatory Visit: Payer: Self-pay | Admitting: Family Medicine

## 2016-09-13 ENCOUNTER — Other Ambulatory Visit: Payer: Self-pay | Admitting: Family Medicine

## 2016-10-19 ENCOUNTER — Other Ambulatory Visit: Payer: Self-pay | Admitting: Family Medicine

## 2017-02-06 ENCOUNTER — Other Ambulatory Visit: Payer: Self-pay | Admitting: Family Medicine

## 2017-02-11 ENCOUNTER — Other Ambulatory Visit: Payer: Self-pay | Admitting: Family Medicine

## 2017-02-23 ENCOUNTER — Other Ambulatory Visit: Payer: Self-pay | Admitting: Family Medicine

## 2017-02-24 ENCOUNTER — Other Ambulatory Visit: Payer: Self-pay | Admitting: Family Medicine

## 2017-03-07 ENCOUNTER — Telehealth: Payer: Self-pay | Admitting: Family Medicine

## 2017-03-07 NOTE — Telephone Encounter (Signed)
Left pt a message with his mother telling him to call and schedule his F/u appt

## 2017-03-26 ENCOUNTER — Other Ambulatory Visit: Payer: Self-pay | Admitting: Family Medicine

## 2017-04-01 ENCOUNTER — Encounter: Payer: Self-pay | Admitting: Family Medicine

## 2017-04-01 ENCOUNTER — Ambulatory Visit: Payer: 59 | Admitting: Family Medicine

## 2017-04-01 VITALS — BP 115/66 | HR 56 | Ht 72.0 in | Wt 242.0 lb

## 2017-04-01 DIAGNOSIS — N401 Enlarged prostate with lower urinary tract symptoms: Secondary | ICD-10-CM | POA: Diagnosis not present

## 2017-04-01 DIAGNOSIS — Z9049 Acquired absence of other specified parts of digestive tract: Secondary | ICD-10-CM | POA: Diagnosis not present

## 2017-04-01 DIAGNOSIS — Z125 Encounter for screening for malignant neoplasm of prostate: Secondary | ICD-10-CM | POA: Diagnosis not present

## 2017-04-01 DIAGNOSIS — F331 Major depressive disorder, recurrent, moderate: Secondary | ICD-10-CM | POA: Diagnosis not present

## 2017-04-01 DIAGNOSIS — F5101 Primary insomnia: Secondary | ICD-10-CM

## 2017-04-01 DIAGNOSIS — K2100 Gastro-esophageal reflux disease with esophagitis, without bleeding: Secondary | ICD-10-CM | POA: Insufficient documentation

## 2017-04-01 LAB — COMPLETE METABOLIC PANEL WITH GFR
AG RATIO: 1.7 (calc) (ref 1.0–2.5)
ALT: 12 U/L (ref 9–46)
AST: 15 U/L (ref 10–35)
Albumin: 4.3 g/dL (ref 3.6–5.1)
Alkaline phosphatase (APISO): 79 U/L (ref 40–115)
BILIRUBIN TOTAL: 1.1 mg/dL (ref 0.2–1.2)
BUN: 14 mg/dL (ref 7–25)
CHLORIDE: 105 mmol/L (ref 98–110)
CO2: 29 mmol/L (ref 20–32)
Calcium: 9.3 mg/dL (ref 8.6–10.3)
Creat: 1.24 mg/dL (ref 0.70–1.33)
GFR, Est African American: 74 mL/min/{1.73_m2} (ref >=60)
GFR, Est Non African American: 64 mL/min/{1.73_m2} (ref >=60)
GLUCOSE: 78 mg/dL (ref 65–99)
Globulin: 2.6 g/dL (calc) (ref 1.9–3.7)
POTASSIUM: 4.6 mmol/L (ref 3.5–5.3)
Sodium: 139 mmol/L (ref 135–146)
TOTAL PROTEIN: 6.9 g/dL (ref 6.1–8.1)

## 2017-04-01 LAB — LIPID PANEL W/REFLEX DIRECT LDL
CHOLESTEROL: 198 mg/dL (ref <200)
HDL: 41 mg/dL (ref >40)
LDL CHOLESTEROL (CALC): 132 mg/dL — AB
Non-HDL Cholesterol (Calc): 157 mg/dL (calc) — ABNORMAL HIGH (ref <130)
TRIGLYCERIDES: 130 mg/dL (ref <150)
Total CHOL/HDL Ratio: 4.8 (calc) (ref <5.0)

## 2017-04-01 LAB — PSA: PSA: 1 ng/mL (ref <=4.0)

## 2017-04-01 MED ORDER — PANTOPRAZOLE SODIUM 40 MG PO TBEC: 40.0000 mg | DELAYED_RELEASE_TABLET | Freq: Every day | ORAL | 3 refills | Status: DC

## 2017-04-01 MED ORDER — FLUOXETINE HCL 20 MG PO CAPS: 60.0000 mg | ORAL_CAPSULE | Freq: Every day | ORAL | 1 refills | Status: DC

## 2017-04-01 MED ORDER — RAMELTEON 8 MG PO TABS: 8.0000 mg | ORAL_TABLET | Freq: Every day | ORAL | 1 refills | Status: DC

## 2017-04-01 MED ORDER — TAMSULOSIN HCL 0.4 MG PO CAPS: ORAL_CAPSULE | ORAL | 3 refills | Status: DC

## 2017-04-01 NOTE — Progress Notes (Addendum)
Subjective:    Patient ID: Terry Vaughan, male    DOB: November 06, 1960, 56 y.o.   MRN: [id number]  HPI Major depressive disorder with anxiety- he is doing ok overall. He says he is just an anxious person.  On fluoxetine 60mg  Daily   Follow-up BPH - He is taking his Flomax but not the avodart and feels like his sxs are well controlled. He doesn't want to continue the Avodart.   GERD - takes his reflex medication regularly but ran out. Has been having a lot of heartburn since out of medication.   He is really struggling with sleep. Says often falls asleep but then wakes up after 30 min and then can't get back to sleep. Happens recurrently overnight.    Review of Systems BP 115/66   Pulse (!) 56   Ht 6' (1.829 m)   Wt 242 lb (109.8 kg)   SpO2 97%   BMI 32.82 kg/m     Allergies  Allergen Reactions  . Meperidine Hcl   . Morphine And Related   . Promethazine Hcl     Other reaction(s): Confusion    Past Medical History:  Diagnosis Date  . Kidney stone   . Perforated bowel (HCC) 10/2012    Past Surgical History:  Procedure Laterality Date  . CHOLECYSTECTOMY  09/2013   Dr. Yetta Numbershrisman, acute cholecystitis  . PARTIAL COLECTOMY  10/2012   Dr. Yetta Numbershrisman, perforated bowel  . PLEURAL SCARIFICATION      Social History   Socioeconomic History  . Marital status: Divorced    Spouse name: Not on file  . Number of children: Not on file  . Years of education: Not on file  . Highest education level: Not on file  Social Needs  . Financial resource strain: Not on file  . Food insecurity - worry: Not on file  . Food insecurity - inability: Not on file  . Transportation needs - medical: Not on file  . Transportation needs - non-medical: Not on file  Occupational History  . Not on file  Tobacco Use  . Smoking status: Never Smoker  . Smokeless tobacco: Never Used  Substance and Sexual Activity  . Alcohol use: No  . Drug use: No  . Sexual activity: Not on file  Other Topics Concern   . Not on file  Social History Narrative  . Not on file    No family history on file.  Outpatient Encounter Medications as of 04/01/2017  Medication Sig  . Cetirizine HCl (ZYRTEC PO) Take by mouth.  Marland Kitchen. FLUoxetine (PROZAC) 20 MG capsule Take 3 capsules (60 mg total) daily by mouth. Due for follow up visit  . pantoprazole (PROTONIX) 40 MG tablet Take 1 tablet (40 mg total) daily by mouth. Due for follow up visit  . ramelteon (ROZEREM) 8 MG tablet Take 1 tablet (8 mg total) at bedtime by mouth.  . tamsulosin (FLOMAX) 0.4 MG CAPS capsule TAKE 1 CAPSULE(0.4 MG) BY MOUTH DAILY  . [DISCONTINUED] clonazePAM (KLONOPIN) 0.25 MG disintegrating tablet DISSOLVE 1 AND 1/2 TABLET BY MOUTH AT NIGHT FOR 1 WEEK, THEN 1 AT BEDTIME FOR 1 WEEK, THEN 1/2 TABLET AT BEDTIME FOR 1 WEEK. THEN STOP  . [DISCONTINUED] dutasteride (AVODART) 0.5 MG capsule Take 1 capsule (0.5 mg total) by mouth daily.  . [DISCONTINUED] EPINEPHrine (EPIPEN 2-PAK) 0.3 mg/0.3 mL IJ SOAJ injection Inject 0.3 mLs (0.3 mg total) into the muscle once.  . [DISCONTINUED] FLUoxetine (PROZAC) 20 MG capsule Take 3 capsules (60 mg  total) by mouth daily. Due for follow up visit  . [DISCONTINUED] ondansetron (ZOFRAN) 4 MG tablet Take 1 tablet (4 mg total) by mouth every 6 (six) hours.  . [DISCONTINUED] oseltamivir (TAMIFLU) 75 MG capsule Take 1 capsule (75 mg total) by mouth every 12 (twelve) hours.  . [DISCONTINUED] pantoprazole (PROTONIX) 40 MG tablet Take 1 tablet (40 mg total) by mouth daily. Due for follow up visit  . [DISCONTINUED] tamsulosin (FLOMAX) 0.4 MG CAPS capsule TAKE 1 CAPSULE(0.4 MG) BY MOUTH DAILY   No facility-administered encounter medications on file as of 04/01/2017.          Objective:   Physical Exam  Constitutional: He is oriented to person, place, and time. He appears well-developed and well-nourished.  HENT:  Head: Normocephalic and atraumatic.  Cardiovascular: Normal rate, regular rhythm and normal heart sounds.   Pulmonary/Chest: Effort normal and breath sounds normal.  Neurological: He is alert and oriented to person, place, and time.  Skin: Skin is warm and dry.  Psychiatric: He has a normal mood and affect. His behavior is normal.          Assessment & Plan:  MDD/Anxiety -PHQ 9 score of 13 and gad 7 score of 14.  Stable. Will continue current regimen. Will work on sleep.  F/U in 6 months.    Insomnia - discussed options. Will try rozerem first since he has a CBL.  Consider Belsomra.   BPH -   Continue flomax. Will d/c Advodart.   GERD - will restart protonix.  New rx sent.

## 2017-04-03 ENCOUNTER — Telehealth: Payer: Self-pay | Admitting: *Deleted

## 2017-04-03 NOTE — Telephone Encounter (Signed)
Pre Authorization sent to cover my meds.[id number]

## 2017-04-07 NOTE — Telephone Encounter (Signed)
[employer]ozer[employer] denied . Can be approved if the patient has one of the following: hx of trial and failure of at least 2 weeks, contraindication or intolerant to two  Of the following Zolpidem(generic Ambien),Zaleplon (generic Sonata), Eszopiclone(generic Lunesta) or the patient has a hx of potentioal substance abuse disorder.

## 2017-04-09 NOTE — Telephone Encounter (Signed)
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

## 2017-04-30 DIAGNOSIS — M7541 Impingement syndrome of right shoulder: Secondary | ICD-10-CM | POA: Diagnosis not present

## 2017-04-30 DIAGNOSIS — M25511 Pain in right shoulder: Secondary | ICD-10-CM | POA: Diagnosis not present

## 2017-05-05 NOTE — Telephone Encounter (Signed)
An appeal was faxed last friday

## 2017-05-21 ENCOUNTER — Telehealth: Payer: Self-pay | Admitting: Family Medicine

## 2017-05-21 NOTE — Telephone Encounter (Signed)
Attempted to contact Pt's mother to see what medication is being referenced- phone line was busy. Pt is overdue for appointment per last OV note, so regardless he will need to come in soon.   Will attempt to callback.

## 2017-05-21 NOTE — Telephone Encounter (Signed)
Appeal finally approved. Notified pharm and patient via vm

## 2017-05-21 NOTE — Telephone Encounter (Signed)
[facility]21/[facility] 05/12/17 approving coverage for Rozerem. Letter placed in PA box.

## 2017-05-22 ENCOUNTER — Ambulatory Visit: Payer: 59 | Admitting: Family Medicine

## 2017-07-29 ENCOUNTER — Encounter: Payer: Self-pay | Admitting: Emergency Medicine

## 2017-07-29 ENCOUNTER — Emergency Department
Admission: EM | Admit: 2017-07-29 | Discharge: 2017-07-29 | Disposition: A | Discharge status: Home / Self Care | Payer: 59 | Source: Home / Self Care | Attending: Emergency Medicine | Admitting: Emergency Medicine

## 2017-07-29 ENCOUNTER — Other Ambulatory Visit: Payer: Self-pay

## 2017-07-29 DIAGNOSIS — J309 Allergic rhinitis, unspecified: Secondary | ICD-10-CM | POA: Diagnosis not present

## 2017-07-29 DIAGNOSIS — J0101 Acute recurrent maxillary sinusitis: Secondary | ICD-10-CM | POA: Diagnosis not present

## 2017-07-29 DIAGNOSIS — R05 Cough: Secondary | ICD-10-CM

## 2017-07-29 DIAGNOSIS — R059 Cough, unspecified: Secondary | ICD-10-CM

## 2017-07-29 MED ORDER — BENZONATATE 100 MG PO CAPS: 100.0000 mg | ORAL_CAPSULE | Freq: Three times a day (TID) | ORAL | 0 refills | Status: DC | PRN

## 2017-07-29 MED ORDER — METHYLPREDNISOLONE ACETATE 80 MG/ML IJ SUSP
80.0000 mg | Freq: Once | INTRAMUSCULAR | Status: AC
  Administered 2017-07-29: 80 mg via INTRAMUSCULAR

## 2017-07-29 MED ORDER — AMOXICILLIN 875 MG PO TABS: 875.0000 mg | ORAL_TABLET | Freq: Two times a day (BID) | ORAL | 0 refills | Status: DC

## 2017-07-29 NOTE — Discharge Instructions (Addendum)
Take antibiotics twice a day. Continue nasal saline. Return to clinic if not better 48-72 hours.

## 2017-07-29 NOTE — ED Triage Notes (Signed)
Pt c/o constant clear nasal drainage. Sinus headache started Sunday with sinus pressure. Some green drainage. Cough started today.

## 2017-07-29 NOTE — ED Provider Notes (Signed)
[facility]    CSN: [id number] Arrival date & time: 07/29/17  1511     History   Chief Complaint Chief Complaint  Patient presents with  . Nasal Congestion    HPI Terry Vaughan is a 57 y.o. male.   HPI Patient enters with onset on Sunday of postnasal drip. He has a history of allergies especially this time of year. Over the last 2 years he has had intermittent sinus infections following allergy attacks. He has received cortisone injections in the past. This has been helpful to him. Today while doing nasal irrigation the mucus he obtained was yellow in color. He does have some cough but usually this occurs after a coughing spell. Past Medical History:  Diagnosis Date  . Kidney stone   . Perforated bowel (HCC) 10/2012    Patient Active Problem List   Diagnosis Date Noted  . Gastroesophageal reflux disease with esophagitis 04/01/2017  . BPH (benign prostatic hyperplasia) 02/09/2015  . Gastroesophageal reflux disease without esophagitis 02/09/2015  . Intestinal obstruction (HCC) 05/16/2014  . Ventral incisional hernia 05/03/2014  . History of cholecystectomy 10/21/2013  . GAD (generalized anxiety disorder) 05/18/2013  . Other specified postprocedural states 05/05/2013  . Diverticulitis of gastrointestinal tract 12/03/2012  . 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 Major depressive disorder, recurrent episode (HCC) 02/25/2006  . RHINITIS, ALLERGIC 02/25/2006    Past Surgical History:  Procedure Laterality Date  . CHOLECYSTECTOMY  09/2013   Dr. Yetta Numbershrisman, acute cholecystitis  . PARTIAL COLECTOMY  10/2012   Dr. Yetta Numbershrisman, perforated bowel  . PLEURAL SCARIFICATION         Home Medications    Prior to Admission medications   Medication Sig Start Date End Date Taking? Authorizing Provider  Cetirizine HCl (ZYRTEC PO) Take by mouth.   Yes [provider]  FLUoxetine (PROZAC) 20 MG capsule Take 3 capsules (60 mg total) daily by mouth. Due for follow up  visit 04/01/17  Yes Agapito GamesMetheney, Catherine D, MD  pantoprazole (PROTONIX) 40 MG tablet Take 1 tablet (40 mg total) daily by mouth. Due for follow up visit 04/01/17  Yes Agapito GamesMetheney, Catherine D, MD  tamsulosin (FLOMAX) 0.4 MG CAPS capsule TAKE 1 CAPSULE(0.4 MG) BY MOUTH DAILY 04/01/17  Yes Agapito GamesMetheney, Catherine D, MD  amoxicillin (AMOXIL) 875 MG tablet Take 1 tablet (875 mg total) by mouth 2 (two) times daily. 07/29/17   Collene Gobbleaub, Kellyanne Ellwanger A, MD  benzonatate (TESSALON) 100 MG capsule Take 1-2 capsules (100-200 mg total) by mouth 3 (three) times daily as needed for cough. 07/29/17   Collene Gobbleaub, Clarissa Laird A, MD  ramelteon (ROZEREM) 8 MG tablet Take 1 tablet (8 mg total) at bedtime by mouth. 04/01/17   Agapito GamesMetheney, Catherine D, MD    Family History History reviewed. No pertinent family history.  Social History Social History   Tobacco Use  . Smoking status: Former Games developermoker  . Smokeless tobacco: Never Used  Substance Use Topics  . Alcohol use: No  . Drug use: No     Allergies   Meperidine hcl; Morphine and related; and Promethazine hcl   Review of Systems Review of Systems  Constitutional: Negative for fever.  HENT: Positive for facial swelling, postnasal drip, rhinorrhea, sneezing and sore throat.   Eyes: Negative.   Respiratory: Positive for cough. Negative for shortness of breath.   Cardiovascular: Negative.   Gastrointestinal: Negative.      Physical Exam Triage Vital Signs ED Triage Vitals  Enc Vitals Group     BP 07/29/17 1537  110/73     Pulse Rate 07/29/17 1537 73     Resp --      Temp 07/29/17 1537 97.9 F (36.6 C)     Temp Source 07/29/17 1537 Oral     SpO2 07/29/17 1537 97 %     Weight 07/29/17 1539 238 lb (108 kg)     Height --      Head Circumference --      Peak Flow --      Pain Score 07/29/17 1539 0     Pain Loc --      Pain Edu? --      Excl. in GC? --    No data found.  Updated Vital Signs BP 110/73 (BP Location: Right Arm)   Pulse 73   Temp 97.9 F (36.6 C) (Oral)    Wt 238 lb (108 kg)   SpO2 97%   BMI 32.28 kg/m   Visual Acuity Right Eye Distance:   Left Eye Distance:   Bilateral Distance:    Right Eye Near:   Left Eye Near:    Bilateral Near:     Physical Exam  Constitutional: He appears well-developed and well-nourished.  HENT:  Right Ear: External ear normal.  Left Ear: External ear normal.  Mouth/Throat: Oropharynx is clear and moist.  There is significant nasal congestion with rhinorrhea. There is redness around the nares. TMs are negative.  Eyes: Pupils are equal, round, and reactive to light.  Neck: Normal range of motion. Neck supple.  Cardiovascular: Normal rate and regular rhythm.  Pulmonary/Chest: Effort normal and breath sounds normal.     UC Treatments / Results  Labs (all labs ordered are listed, but only abnormal results are displayed) Labs Reviewed - No data to display  EKG  EKG Interpretation None       Radiology No results found.  Procedures Procedures (including critical care time)  Medications Ordered in UC Medications  methylPREDNISolone acetate (DEPO-MEDROL) injection 80 mg (not administered)     Initial Impression / Assessment and Plan / UC Course  I have reviewed the triage vital signs and the nursing notes.  Pertinent labs & imaging results that were available during my care of the patient were reviewed by me and considered in my medical decision making (see chart for details). Patient presents with allergic symptoms. He has a history of sinusitis. He has responded well in the past to Depo-Medrol. We'll go ahead and administer 80 mg Depo-Medrol and a prescription for amoxicillin twice a day for 10 days.      Final Clinical Impressions(s) / UC Diagnoses   Final diagnoses:  Allergic rhinitis, unspecified seasonality, unspecified trigger  Acute recurrent maxillary sinusitis  Cough    ED Discharge Orders        Ordered    amoxicillin (AMOXIL) 875 MG tablet  2 times daily     07/29/17  1550    benzonatate (TESSALON) 100 MG capsule  3 times daily PRN     07/29/17 1550     Take antibiotics twice a day. Continue nasal saline. Return to clinic if not better 48-72 hours.  Controlled Substance Prescriptions [place] Controlled Substance Registry consulted? Not Applicable   Collene Gobble, MD 07/29/17 2059

## 2017-10-17 ENCOUNTER — Other Ambulatory Visit: Payer: Self-pay | Admitting: Family Medicine

## 2017-11-04 ENCOUNTER — Ambulatory Visit (INDEPENDENT_AMBULATORY_CARE_PROVIDER_SITE_OTHER): Payer: 59

## 2017-11-04 ENCOUNTER — Encounter: Payer: Self-pay | Admitting: Family Medicine

## 2017-11-04 ENCOUNTER — Ambulatory Visit: Payer: 59 | Admitting: Family Medicine

## 2017-11-04 VITALS — BP 124/80 | HR 60 | Temp 98.7°F | Ht 76.0 in | Wt 239.0 lb

## 2017-11-04 DIAGNOSIS — R059 Cough, unspecified: Secondary | ICD-10-CM

## 2017-11-04 DIAGNOSIS — G43901 Migraine, unspecified, not intractable, with status migrainosus: Secondary | ICD-10-CM

## 2017-11-04 DIAGNOSIS — R079 Chest pain, unspecified: Secondary | ICD-10-CM | POA: Diagnosis not present

## 2017-11-04 DIAGNOSIS — R05 Cough: Secondary | ICD-10-CM

## 2017-11-04 MED ORDER — PREDNISONE 10 MG PO TABS: 30.0000 mg | ORAL_TABLET | Freq: Every day | ORAL | 0 refills | Status: DC

## 2017-11-04 MED ORDER — GUAIFENESIN-CODEINE 100-10 MG/5ML PO SOLN: 5.0000 mL | Freq: Four times a day (QID) | ORAL | 0 refills | Status: DC | PRN

## 2017-11-04 MED ORDER — KETOROLAC TROMETHAMINE 60 MG/2ML IM SOLN
60.0000 mg | Freq: Once | INTRAMUSCULAR | Status: AC
  Administered 2017-11-04: 60 mg via INTRAMUSCULAR

## 2017-11-04 MED ORDER — ONDANSETRON HCL 4 MG PO TABS
8.0000 mg | ORAL_TABLET | Freq: Once | ORAL | Status: AC
  Administered 2017-11-04: 8 mg via ORAL

## 2017-11-04 MED ORDER — AZITHROMYCIN 250 MG PO TABS: 250.0000 mg | ORAL_TABLET | Freq: Every day | ORAL | 0 refills | Status: DC

## 2017-11-04 MED ORDER — DEXAMETHASONE SODIUM PHOSPHATE 10 MG/ML IJ SOLN
10.0000 mg | Freq: Once | INTRAMUSCULAR | Status: AC
  Administered 2017-11-04: 10 mg via INTRAMUSCULAR

## 2017-11-04 MED ORDER — ALBUTEROL SULFATE HFA 108 (90 BASE) MCG/ACT IN AERS: 2.0000 | INHALATION_SPRAY | Freq: Four times a day (QID) | RESPIRATORY_TRACT | 0 refills | Status: DC | PRN

## 2017-11-04 NOTE — Patient Instructions (Addendum)
Thank you for coming in today. Take prenisone and azithromycin  Use codeine for cough as needed.  Do not drive with codeine.  Use albuterol as needed.  Recheck with Dr Eppie GibsonMetheny in a few weeks.  I do recommend that you do go to the Emergency Room  If you do not go please make an appointment with Dr Linford ArnoldMetheney or me for tomorrow.  If you worsen tonight go to the Emergency Room.   Call or go to the emergency room if you get worse, have trouble breathing, have chest pains, or palpitations.    Nonspecific Chest Pain Chest pain can be caused by many different conditions. There is a chance that your pain could be related to something serious, such as a heart attack or a blood clot in your lungs. Chest pain can also be caused by conditions that are not life-threatening. If you have chest pain, it is very important to follow up with your doctor. Follow these instructions at home: Medicines  If you were prescribed an antibiotic medicine, take it as told by your doctor. Do not stop taking the antibiotic even if you start to feel better.  Take over-the-counter and prescription medicines only as told by your doctor. Lifestyle  Do not use any products that contain nicotine or tobacco, such as cigarettes and e-cigarettes. If you need help quitting, ask your doctor.  Do not drink alcohol.  Make lifestyle changes as told by your doctor. These may include: ? Getting regular exercise. Ask your doctor for some activities that are safe for you. ? Eating a heart-healthy diet. A diet specialist (dietitian) can help you to learn healthy eating options. ? Staying at a healthy weight. ? Managing diabetes, if needed. ? Lowering your stress, as with deep breathing or spending time in nature. General instructions  Avoid any activities that make you feel chest pain.  If your chest pain is because of heartburn: ? Raise (elevate) the head of your bed about 6 inches (15 cm). You can do this by putting blocks under  the bed legs at the head of the bed. ? Do not sleep with extra pillows under your head. That does not help heartburn.  Keep all follow-up visits as told by your doctor. This is important. This includes any further testing if your chest pain does not go away. Contact a doctor if:  Your chest pain does not go away.  You have a rash with blisters on your chest.  You have a fever.  You have chills. Get help right away if:  Your chest pain is worse.  You have a cough that gets worse, or you cough up blood.  You have very bad (severe) pain in your belly (abdomen).  You are very weak.  You pass out (faint).  You have either of these for no clear reason: ? Sudden chest discomfort. ? Sudden discomfort in your arms, back, neck, or jaw.  You have shortness of breath at any time.  You suddenly start to sweat, or your skin gets clammy.  You feel sick to your stomach (nauseous).  You throw up (vomit).  You suddenly feel light-headed or dizzy.  Your heart starts to beat fast, or it feels like it is skipping beats. These symptoms may be an emergency. Do not wait to see if the symptoms will go away. Get medical help right away. Call your local emergency services (911 in the [place] Do not drive yourself to the hospital. This information is not intended to  replace advice given to you by your health care provider. Make sure you discuss any questions you have with your health care provider. Document Released: 10/23/2007 Document Revised: 01/29/2016 Document Reviewed: 01/29/2016 Elsevier Interactive Patient Education  2017 [employer].

## 2017-11-04 NOTE — Progress Notes (Signed)
Terry Vaughan is a 57 y.o. male who [facility] today for cough shortness of breath headache chest pain.  Terry Vaughan notes a 2 to 3-week history of cough.  He notes some shortness of breath starting on the last few days.  This morning he developed a severe headache.  He notes the headache arrived gradually and did not arrive all of a sudden.  He notes the headache is bilateral bandlike and pounding.  Headache is associated with photophobia and nausea and is consistent with prior episodes of migraine.  He notes with migraine he typically has done well with the typical injectable headache cocktail in clinic.  He notes that he is allergic to Phenergan as it causes confusion.  He does have an allergy to morphine and Demerol but notes that he can tolerate codeine.   He notes the cost is quite severe and obnoxious and associated with some productive sputum and some wheezing and mild shortness of breath.  In the past Tessalon Perles have not been very helpful.  He has used an albuterol inhaler in the past but denies any knowledge or personal history of asthma.  He describes his chest pain is central and burning.  He thinks the chest pain is not worse with exertion but he thinks the shortness of breath might be a bit worse with exertion.  He denies leg swelling or palpitations.  He notes the chest pain is been present for the last day and a half and is not changing.  He does not think he has had an EKG recently had any other medical system.   ROS as above: No , visual changes, nausea,, constipation, dizziness, abdominal pain, skin rash, fevers, chills, night sweats, weight loss, swollen lymph nodes, body aches, joint swelling, muscle aches, pain,  mood changes, visual or auditory hallucinations.  Positive for headache chest pain nausea vomiting shortness of breath  Exam:    BP 124/80   Pulse 60   Temp 98.7 F (37.1 C) (Oral)   Ht 6\' 4"  (1.93 m)   Wt 239 lb (108.4 kg)   BMI 29.09 kg/m  Gen: Well NAD and pain appearing HEENT: EOMI,  MMM PERRL Lungs: Normal work of breathing. CTABL Heart: RRR no MRG Abd: NABS, Soft. Nondistended, Nontender mature scar along midline abdomen Exts: Brisk capillary refill, warm and well perfused.  No edema  Medications given in clinic: Patient was given 60 mg of Toradol, 10 mg of dexamethasone and 8 mg of Zofran IM.  His headache and nausea improved significantly and after 1 hour he was feeling much better.  Twelve-lead EKG shows sinus rhythm at 53 bpm.  Decreased voltage with flattened lateral precordial T waves and some less than 1 mm ST segment depression at V3 and V4.  No Q waves.  No ST segment elevation. No prior EKGs to compare to.  Lab and Radiology Results No results found for this or any previous visit (from the past 72 hour(s)). Dg Chest 2 View  Result Date: 11/04/2017 CLINICAL DATA:  Cough for 3 weeks EXAM: CHEST - 2 VIEW COMPARISON:  July 25, 2011 FINDINGS: There is no edema or consolidation. Heart size and pulmonary vascularity are normal. No adenopathy. There is an old healed fracture of the right posterior eighth rib. IMPRESSION: No edema or consolidation. Electronically Signed   By: Bretta BangWilliam  Woodruff III M.D.   On: 11/04/2017 16:04  I personally (independently) visualized and  performed the interpretation of the images attached in this note.    Assessment and Plan: 57 y.o. male with  Cough: Likely bronchitic type.  Cough is now present for a few weeks.  Chest x-ray is unremarkable.  Plan for treatment with albuterol, codeine cough syrup, azithromycin and prednisone.  Recheck tomorrow.  Headache: Migraine type.  Severe initially.  Patient had significant improvement with medications listed below.  Recheck tomorrow.  Warned about worsening symptoms.  Chest pain or shortness of breath.  Likely related to  underlying cough because of bronchitis.  However his EKG is abnormal.  I advised going to the emergency room and patient declined.  We discussed risks and benefits.  Patient will follow-up with me tomorrow in clinic.  We discussed that there is a risk of death or serious illness but not go to the emergency room tonight and he understands these risks.  Patient will go to the emergency room if he worsens before tomorrow.  Plan to recheck EKG and potentially get labs tomorrow.  Severe concerning symptoms at onset   Orders Placed This Encounter  Procedures  . DG Chest 2 View    Order Specific Question:   Reason for exam:    Answer:   Cough, assess intra-thoracic pathology    Order Specific Question:   Preferred imaging location?    Answer:   [facility]  . EKG 12-Lead   Meds ordered this encounter  Medications  . ketorolac (TORADOL) injection 60 mg  . dexamethasone (DECADRON) injection 10 mg  . ondansetron (ZOFRAN) tablet 8 mg  . predniSONE (DELTASONE) 10 MG tablet    Sig: Take 3 tablets (30 mg total) by mouth daily with breakfast.    Dispense:  15 tablet    Refill:  0  . azithromycin (ZITHROMAX) 250 MG tablet    Sig: Take 1 tablet (250 mg total) by mouth daily. Take first 2 tablets together, then 1 every day until finished.    Dispense:  6 tablet    Refill:  0  . guaiFENesin-codeine 100-10 MG/5ML syrup    Sig: Take 5 mLs by mouth every 6 (six) hours as needed.    Dispense:  236 mL    Refill:  0  . albuterol (PROVENTIL HFA;VENTOLIN HFA) 108 (90 Base) MCG/ACT inhaler    Sig: Inhale 2 puffs into the lungs every 6 (six) hours as needed for wheezing or shortness of breath.    Dispense:  1 Inhaler    Refill:  0     Historical information moved to improve visibility of documentation.  Past Medical History:  Diagnosis Date  . Kidney stone   . Perforated bowel (HCC) 10/2012   Past Surgical History:  Procedure Laterality Date  . CHOLECYSTECTOMY  09/2013   Dr. Yetta Numbers,  acute cholecystitis  . PARTIAL COLECTOMY  10/2012   Dr. Yetta Numbers, perforated bowel  . PLEURAL SCARIFICATION     Social History   Tobacco Use  . Smoking status: Former Games developer  . Smokeless tobacco: Never Used  Substance Use Topics  . Alcohol use: No   family history is not on file.  Medications: Current Outpatient Medications  Medication Sig Dispense Refill  . Cetirizine HCl (ZYRTEC PO) Take by mouth.    Marland Kitchen FLUoxetine (PROZAC) 20 MG capsule TAKE 3 CAPSULES BY MOUTH DAILY. 270 capsule 0  . pantoprazole (PROTONIX) 40 MG tablet Take 1 tablet (40 mg total) daily by mouth. Due for follow up visit 90 tablet 3  . ramelteon (ROZEREM) 8  MG tablet Take 1 tablet (8 mg total) at bedtime by mouth. 30 tablet 1  . tamsulosin (FLOMAX) 0.4 MG CAPS capsule TAKE 1 CAPSULE(0.4 MG) BY MOUTH DAILY 90 capsule 3  . albuterol (PROVENTIL HFA;VENTOLIN HFA) 108 (90 Base) MCG/ACT inhaler Inhale 2 puffs into the lungs every 6 (six) hours as needed for wheezing or shortness of breath. 1 Inhaler 0  . azithromycin (ZITHROMAX) 250 MG tablet Take 1 tablet (250 mg total) by mouth daily. Take first 2 tablets together, then 1 every day until finished. 6 tablet 0  . guaiFENesin-codeine 100-10 MG/5ML syrup Take 5 mLs by mouth every 6 (six) hours as needed. 236 mL 0  . predniSONE (DELTASONE) 10 MG tablet Take 3 tablets (30 mg total) by mouth daily with breakfast. 15 tablet 0   No current facility-administered medications for this visit.    Allergies  Allergen Reactions  . Meperidine Hcl Nausea Only  . Morphine And Related Nausea Only  . Promethazine Hcl     Other reaction(s): Confusion    Health Maintenance Health Maintenance  Topic Date Due  . INFLUENZA VACCINE  12/18/2017  . COLONOSCOPY  04/29/2023  . TETANUS/TDAP  08/28/2025  . Hepatitis C Screening  Completed  . HIV Screening  Completed    Discussed warning signs or symptoms. Please see discharge instructions. Patient expresses understanding.

## 2017-11-05 ENCOUNTER — Ambulatory Visit: Payer: 59 | Admitting: Family Medicine

## 2017-12-02 ENCOUNTER — Ambulatory Visit (INDEPENDENT_AMBULATORY_CARE_PROVIDER_SITE_OTHER): Payer: 59 | Admitting: Family Medicine

## 2017-12-02 ENCOUNTER — Ambulatory Visit (INDEPENDENT_AMBULATORY_CARE_PROVIDER_SITE_OTHER): Payer: 59

## 2017-12-02 ENCOUNTER — Encounter: Payer: Self-pay | Admitting: Family Medicine

## 2017-12-02 VITALS — BP 101/65 | HR 85 | Ht 76.0 in | Wt 238.0 lb

## 2017-12-02 DIAGNOSIS — R05 Cough: Secondary | ICD-10-CM

## 2017-12-02 DIAGNOSIS — R059 Cough, unspecified: Secondary | ICD-10-CM

## 2017-12-02 DIAGNOSIS — R0602 Shortness of breath: Secondary | ICD-10-CM

## 2017-12-02 DIAGNOSIS — R9431 Abnormal electrocardiogram [ECG] [EKG]: Secondary | ICD-10-CM | POA: Diagnosis not present

## 2017-12-02 NOTE — Progress Notes (Signed)
Terry Vaughan is a 57 y.o. male who presents to [facility]: Primary Care Sports Medicine today for shortness of breath, fatigue.   Terry Vaughan was seen a month ago for cough and shortness of breath.  He had symptoms concerning for possible NSTEMI and had EKG changes consisted of ST segment depression.  He was advised to go to the emergency room and refused.  He did not follow-up the next day as directed.  In the interim he denies any chest pain but notes that he continues to have cough and shortness of breath as below.  He was seen one month ago for cough and shortness of breath. He improved with an albuterol inhaler, azithromycin, and prednisone but his shortness of breath persisted. He is still coughing up some rusty colored sputum and having episodes of shortness of breath saying the he can only climb two flights of stairs before he is winded. He also says he has been having severe sweating that he notices both during the day and at night. He denies orthopnea and PND.  He denies significant leg swelling.    ROS as above:  Exam:  BP 101/65   Pulse 85   Ht 6\' 4"  (1.93 m)   Wt 238 lb (108 kg)   BMI 28.97 kg/m  Gen: Well NAD HEENT: EOMI,  MMM Lungs: Normal work of breathing. CTABL Heart: RRR no MRG Abd: NABS, Soft. Nondistended, Nontender Exts: Brisk capillary refill, warm and well perfused.   Lab and Radiology Results Chest x-ray obtained today images personally independently reviewed. No infiltrate.  No masses.  No pneumothorax.  No pulmonary edema. Awaiting formal radiology review  12 lead ECG:  Rate 66 bpm.  Sinus rhythm.  Abnormal ECG showing nonspecific small ST depressions. No ST elevations. This ECG is unchanged from his ECG on 11/04/2017.    Assessment and Plan: 57 y.o. male with shortness of breath. Differential includes new heart failure vs pneumonia. His ECG is unchanged from  last month. Today we will repeat his chest xray given he is still having coughing symptoms.  Also he will have labs done today including CBC, CMP, and BNP. We are waiting for the read of his chest x ray but no acute process is suspected. A transthoracic echocardiogram has been ordered to assess for possible heart failure.  Given exertional dyspnea and EKG changes we will also refer to cardiology anticipate stress test.  Lab results and echocardiogram may change the urgency in which the referral is made.   Orders Placed This Encounter  Procedures  . DG Chest 2 View    Order Specific Question:   Reason for exam:    Answer:   Cough, assess intra-thoracic pathology    Order Specific Question:   Preferred imaging location?    Answer:   [facility]  . CBC  . COMPLETE METABOLIC PANEL WITH GFR  . B Nat Peptide  . Ambulatory referral to Cardiology    Referral Priority:   Routine    Referral Type:   Consultation    Referral Reason:   Specialty Services Required    Requested Specialty:   Cardiology    Number of Visits Requested:   1  . EKG    Standing Status:   Future    Standing Expiration Date:   12/02/2018  . ECHOCARDIOGRAM COMPLETE    Standing Status:   Future    Standing Expiration Date:   03/05/2019    Order  Specific Question:   Where should this test be performed    Answer:   [facility]    Order Specific Question:   Perflutren DEFINITY (image enhancing agent) should be administered unless hypersensitivity or allergy exist    Answer:   Administer Perflutren    Order Specific Question:   Expected Date:    Answer:   1 month   No orders of the defined types were placed in this encounter.    Historical information moved to improve visibility of documentation.  Past Medical History:  Diagnosis Date  . Kidney stone   . Perforated bowel (HCC) 10/2012   Past Surgical History:  Procedure Laterality Date  . CHOLECYSTECTOMY  09/2013   Dr. Yetta Numbershrisman, acute cholecystitis    . PARTIAL COLECTOMY  10/2012   Dr. Yetta Numbershrisman, perforated bowel  . PLEURAL SCARIFICATION     Social History   Tobacco Use  . Smoking status: Former Games developermoker  . Smokeless tobacco: Never Used  Substance Use Topics  . Alcohol use: No   family history is not on file.  Medications: Current Outpatient Medications  Medication Sig Dispense Refill  . albuterol (PROVENTIL HFA;VENTOLIN HFA) 108 (90 Base) MCG/ACT inhaler Inhale 2 puffs into the lungs every 6 (six) hours as needed for wheezing or shortness of breath. 1 Inhaler 0  . Cetirizine HCl (ZYRTEC PO) Take by mouth.    Marland Kitchen. FLUoxetine (PROZAC) 20 MG capsule TAKE 3 CAPSULES BY MOUTH DAILY. 270 capsule 0  . guaiFENesin-codeine 100-10 MG/5ML syrup Take 5 mLs by mouth every 6 (six) hours as needed. 236 mL 0  . pantoprazole (PROTONIX) 40 MG tablet Take 1 tablet (40 mg total) daily by mouth. Due for follow up visit 90 tablet 3  . ramelteon (ROZEREM) 8 MG tablet Take 1 tablet (8 mg total) at bedtime by mouth. 30 tablet 1  . tamsulosin (FLOMAX) 0.4 MG CAPS capsule TAKE 1 CAPSULE(0.4 MG) BY MOUTH DAILY 90 capsule 3   No current facility-administered medications for this visit.    Allergies  Allergen Reactions  . Meperidine Hcl Nausea Only  . Morphine And Related Nausea Only  . Promethazine Hcl     Other reaction(s): Confusion     Discussed warning signs or symptoms. Please see discharge instructions. Patient expresses understanding.  I personally was present and performed or re-performed the history, physical exam and medical decision-making activities of this service and have verified that the service and findings are accurately documented in the student's note. ___________________________________________ Clementeen GrahamEvan Othel Dicostanzo M.D., ABFM., CAQSM. Primary Care and Sports Medicine Adjunct Instructor of Family Medicine  [employer]

## 2017-12-02 NOTE — Patient Instructions (Signed)
Thank you for coming in today. Get labs and xray.  I will get results to you today.  Return with Dr Linford Arnoldmetheney if not better.    Cough, Adult A cough helps to clear your throat and lungs. A cough may last only 2-3 weeks (acute), or it may last longer than 8 weeks (chronic). Many different things can cause a cough. A cough may be a sign of an illness or another medical condition. Follow these instructions at home:  Pay attention to any changes in your cough.  Take medicines only as told by your doctor. ? If you were prescribed an antibiotic medicine, take it as told by your doctor. Do not stop taking it even if you start to feel better. ? Talk with your doctor before you try using a cough medicine.  Drink enough fluid to keep your pee (urine) clear or pale yellow.  If the air is dry, use a cold steam vaporizer or humidifier in your home.  Stay away from things that make you cough at work or at home.  If your cough is worse at night, try using extra pillows to raise your head up higher while you sleep.  Do not smoke, and try not to be around smoke. If you need help quitting, ask your doctor.  Do not have caffeine.  Do not drink alcohol.  Rest as needed. Contact a doctor if:  You have new problems (symptoms).  You cough up yellow fluid (pus).  Your cough does not get better after 2-3 weeks, or your cough gets worse.  Medicine does not help your cough and you are not sleeping well.  You have pain that gets worse or pain that is not helped with medicine.  You have a fever.  You are losing weight and you do not know why.  You have night sweats. Get help right away if:  You cough up blood.  You have trouble breathing.  Your heartbeat is very fast. This information is not intended to replace advice given to you by your health care provider. Make sure you discuss any questions you have with your health care provider. Document Released: 01/17/2011 Document Revised:  10/12/2015 Document Reviewed: 07/13/2014 Elsevier Interactive Patient Education  [employer]2018 Elsevier Inc.

## 2017-12-03 DIAGNOSIS — R0602 Shortness of breath: Secondary | ICD-10-CM | POA: Insufficient documentation

## 2017-12-03 DIAGNOSIS — R9431 Abnormal electrocardiogram [ECG] [EKG]: Secondary | ICD-10-CM | POA: Insufficient documentation

## 2017-12-03 LAB — CBC
HEMATOCRIT: 40.7 % (ref 38.5–50.0)
HEMOGLOBIN: 13.8 g/dL (ref 13.2–17.1)
MCH: 31 pg (ref 27.0–33.0)
MCHC: 33.9 g/dL (ref 32.0–36.0)
MCV: 91.5 fL (ref 80.0–100.0)
MPV: 11.7 fL (ref 7.5–12.5)
Platelets: 254 10*3/uL (ref 140–400)
RBC: 4.45 10*6/uL (ref 4.20–5.80)
RDW: 13 % (ref 11.0–15.0)
WBC: 5.7 10*3/uL (ref 3.8–10.8)

## 2017-12-03 LAB — COMPLETE METABOLIC PANEL WITH GFR
AG Ratio: 1.8 (calc) (ref 1.0–2.5)
ALBUMIN MSPROF: 4.2 g/dL (ref 3.6–5.1)
ALT: 17 U/L (ref 9–46)
AST: 23 U/L (ref 10–35)
Alkaline phosphatase (APISO): 80 U/L (ref 40–115)
BILIRUBIN TOTAL: 0.9 mg/dL (ref 0.2–1.2)
BUN / CREAT RATIO: 12 (calc) (ref 6–22)
BUN: 20 mg/dL (ref 7–25)
CHLORIDE: 108 mmol/L (ref 98–110)
CO2: 27 mmol/L (ref 20–32)
Calcium: 9.4 mg/dL (ref 8.6–10.3)
Creat: 1.66 mg/dL — ABNORMAL HIGH (ref 0.70–1.33)
GFR, EST AFRICAN AMERICAN: 52 mL/min/{1.73_m2} — AB (ref >=60)
GFR, Est Non African American: 45 mL/min/{1.73_m2} — ABNORMAL LOW (ref >=60)
GLOBULIN: 2.3 g/dL (ref 1.9–3.7)
GLUCOSE: 84 mg/dL (ref 65–99)
Potassium: 4.1 mmol/L (ref 3.5–5.3)
Sodium: 141 mmol/L (ref 135–146)
Total Protein: 6.5 g/dL (ref 6.1–8.1)

## 2017-12-03 LAB — BRAIN NATRIURETIC PEPTIDE: Brain Natriuretic Peptide: 43 pg/mL (ref <100)

## 2018-01-16 ENCOUNTER — Other Ambulatory Visit: Payer: Self-pay | Admitting: Family Medicine

## 2018-01-16 ENCOUNTER — Other Ambulatory Visit: Payer: Self-pay | Admitting: *Deleted

## 2018-02-17 ENCOUNTER — Other Ambulatory Visit: Payer: Self-pay | Admitting: Family Medicine

## 2018-02-17 NOTE — Telephone Encounter (Signed)
NEEDS APPOINTMENT 

## 2018-03-10 ENCOUNTER — Ambulatory Visit: Payer: 59 | Admitting: Family Medicine

## 2018-03-10 ENCOUNTER — Encounter: Payer: Self-pay | Admitting: Family Medicine

## 2018-03-10 VITALS — BP 140/82 | HR 59 | Temp 98.4°F | Ht 76.0 in | Wt 243.0 lb

## 2018-03-10 DIAGNOSIS — J0101 Acute recurrent maxillary sinusitis: Secondary | ICD-10-CM | POA: Diagnosis not present

## 2018-03-10 DIAGNOSIS — J029 Acute pharyngitis, unspecified: Secondary | ICD-10-CM

## 2018-03-10 LAB — POCT RAPID STREP A (OFFICE): Rapid Strep A Screen: NEGATIVE

## 2018-03-10 MED ORDER — PREDNISONE 10 MG PO TABS: 30.0000 mg | ORAL_TABLET | Freq: Every day | ORAL | 0 refills | Status: DC

## 2018-03-10 MED ORDER — CEFDINIR 300 MG PO CAPS: 300.0000 mg | ORAL_CAPSULE | Freq: Two times a day (BID) | ORAL | 0 refills | Status: DC

## 2018-03-10 NOTE — Progress Notes (Signed)
Terry Vaughan is a 57 y.o. male who presents to [facility]: Primary Care Sports Medicine today for sore throat facial pain and pressure headache.  Symptoms started 3 days ago and are slightly worsening.  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  He notes the Nettie pot helped a bit to relieve some of the pressure however he does feel ill.  He notes sick contacts at work.  No vomiting diarrhea chest pain or palpitations.  Symptoms are consistent with previous episodes of sinusitis.   ROS as above:  Exam:  BP 140/82   Pulse (!) 59   Temp 98.4 F (36.9 C) (Oral)   Ht 6\' 4"  (1.93 m)   Wt 243 lb (110.2 kg)   BMI 29.58 kg/m  Wt Readings from Last 5 Encounters:  03/10/18 243 lb (110.2 kg)  12/02/17 238 lb (108 kg)  11/04/17 239 lb (108.4 kg)  07/29/17 238 lb (108 kg)  04/01/17 242 lb (109.8 kg)    Gen: Well NAD HEENT: EOMI,  MMM maxillary and frontal sinuses are tender to palpation.  Clear nasal discharge with inflamed nasal turbinates bilaterally.  Posterior pharynx is erythematous without exudate.  Normal tympanic membranes bilaterally.  Mild cervical lymphadenopathy present bilaterally. Lungs: Normal work of breathing. CTABL Heart: RRR no MRG Abd: NABS, Soft. Nondistended, Nontender Exts: Brisk capillary refill, warm and well perfused.   Lab and Radiology Results Results for orders placed or performed in visit on 03/10/18 (from the past 72 hour(s))  POCT rapid strep A     Status: None   Collection Time: 03/10/18 11:44 AM  Result Value Ref Range   Rapid Strep A Screen Negative Negative   No results found.    Assessment and Plan: 57 y.o. male with sinusitis.  Empiric treatment with prednisone and Omnicef.  Additionally continue over-the-counter medications.  Recheck if not improving.  Return sooner if needed.   Orders Placed This Encounter  Procedures  . POCT rapid strep A    Meds ordered this encounter  Medications  . predniSONE (DELTASONE) 10 MG tablet    Sig: Take 3 tablets (30 mg total) by mouth daily with breakfast.    Dispense:  15 tablet    Refill:  0  . cefdinir (OMNICEF) 300 MG capsule    Sig: Take 1 capsule (300 mg total) by mouth 2 (two) times daily.    Dispense:  14 capsule    Refill:  0     Historical information moved to improve visibility of documentation.  Past Medical History:  Diagnosis Date  . Kidney stone   . Perforated bowel (HCC) 10/2012   Past Surgical History:  Procedure Laterality Date  . CHOLECYSTECTOMY  09/2013   Dr. Yetta Numbers, acute cholecystitis  . PARTIAL COLECTOMY  10/2012   Dr. Yetta Numbers, perforated bowel  . PLEURAL SCARIFICATION     Social History   Tobacco Use  . Smoking status: Former Games developer  . Smokeless tobacco: Never Used  Substance Use Topics  . Alcohol use: No   family history is not on file.  Medications: Current Outpatient Medications  Medication Sig Dispense Refill  . albuterol (PROVENTIL HFA;VENTOLIN HFA) 108 (90 Base) MCG/ACT inhaler Inhale 2 puffs into the lungs every 6 (six) hours as needed for wheezing or shortness of breath. 1 Inhaler 0  . Cetirizine HCl (ZYRTEC PO) Take by mouth.    Marland Kitchen FLUoxetine (PROZAC) 20 MG capsule Take 3 capsules (60 mg total) by mouth  daily. NEEDS APPOINTMENT 90 capsule 0  . pantoprazole (PROTONIX) 40 MG tablet Take 1 tablet (40 mg total) daily by mouth. Due for follow up visit 90 tablet 3  . ramelteon (ROZEREM) 8 MG tablet Take 1 tablet (8 mg total) at bedtime by mouth. 30 tablet 1  . tamsulosin (FLOMAX) 0.4 MG CAPS capsule TAKE 1 CAPSULE(0.4 MG) BY MOUTH DAILY 90 capsule 3  . cefdinir (OMNICEF) 300 MG capsule Take 1 capsule (300 mg total) by mouth 2 (two) times daily. 14 capsule 0  . predniSONE (DELTASONE) 10 MG tablet Take 3 tablets (30 mg total) by mouth daily with breakfast. 15 tablet 0   No current facility-administered medications for this visit.    Allergies   Allergen Reactions  . Meperidine Hcl Nausea Only  . Morphine And Related Nausea Only  . Promethazine Hcl     Other reaction(s): Confusion     Discussed warning signs or symptoms. Please see discharge instructions. Patient expresses understanding.

## 2018-03-10 NOTE — Patient Instructions (Signed)
Thank you for coming in today. Take prednisone and omnicef.  OK to continue over the counter medicine.  Let me know if no better.  Call or go to the emergency room if you get worse, have trouble breathing, have chest pains, or palpitations.     Sinusitis, Adult Sinusitis is soreness and inflammation of your sinuses. Sinuses are hollow spaces in the bones around your face. Your sinuses are located:  Around your eyes.  In the middle of your forehead.  Behind your nose.  In your cheekbones.  Your sinuses and nasal passages are lined with a stringy fluid (mucus). Mucus normally drains out of your sinuses. When your nasal tissues become inflamed or swollen, the mucus can become trapped or blocked so air cannot flow through your sinuses. This allows bacteria, viruses, and funguses to grow, which leads to infection. Sinusitis can develop quickly and last for 7?10 days (acute) or for more than 12 weeks (chronic). Sinusitis often develops after a cold. What are the causes? This condition is caused by anything that creates swelling in the sinuses or stops mucus from draining, including:  Allergies.  Asthma.  Bacterial or viral infection.  Abnormally shaped bones between the nasal passages.  Nasal growths that contain mucus (nasal polyps).  Narrow sinus openings.  Pollutants, such as chemicals or irritants in the air.  A foreign object stuck in the nose.  A fungal infection. This is rare.  What increases the risk? The following factors may make you more likely to develop this condition:  Having allergies or asthma.  Having had a recent cold or respiratory tract infection.  Having structural deformities or blockages in your nose or sinuses.  Having a weak immune system.  Doing a lot of swimming or diving.  Overusing nasal sprays.  Smoking.  What are the signs or symptoms? The main symptoms of this condition are pain and a feeling of pressure around the affected  sinuses. Other symptoms include:  Upper toothache.  Earache.  Headache.  Bad breath.  Decreased sense of smell and taste.  A cough that may get worse at night.  Fatigue.  Fever.  Thick drainage from your nose. The drainage is often green and it may contain pus (purulent).  Stuffy nose or congestion.  Postnasal drip. This is when extra mucus collects in the throat or back of the nose.  Swelling and warmth over the affected sinuses.  Sore throat.  Sensitivity to light.  How is this diagnosed? This condition is diagnosed based on symptoms, a medical history, and a physical exam. To find out if your condition is acute or chronic, your health care provider may:  Look in your nose for signs of nasal polyps.  Tap over the affected sinus to check for signs of infection.  View the inside of your sinuses using an imaging device that has a light attached (endoscope).  If your health care provider suspects that you have chronic sinusitis, you may also:  Be tested for allergies.  Have a sample of mucus taken from your nose (nasal culture) and checked for bacteria.  Have a mucus sample examined to see if your sinusitis is related to an allergy.  If your sinusitis does not respond to treatment and it lasts longer than 8 weeks, you may have an MRI or CT scan to check your sinuses. These scans also help to determine how severe your infection is. In rare cases, a bone biopsy may be done to rule out more serious types of  fungal sinus disease. How is this treated? Treatment for sinusitis depends on the cause and whether your condition is chronic or acute. If a virus is causing your sinusitis, your symptoms will go away on their own within 10 days. You may be given medicines to relieve your symptoms, including:  Topical nasal decongestants. They shrink swollen nasal passages and let mucus drain from your sinuses.  Antihistamines. These drugs block inflammation that is triggered by  allergies. This can help to ease swelling in your nose and sinuses.  Topical nasal corticosteroids. These are nasal sprays that ease inflammation and swelling in your nose and sinuses.  Nasal saline washes. These rinses can help to get rid of thick mucus in your nose.  If your condition is caused by bacteria, you will be given an antibiotic medicine. If your condition is caused by a fungus, you will be given an antifungal medicine. Surgery may be needed to correct underlying conditions, such as narrow nasal passages. Surgery may also be needed to remove polyps. Follow these instructions at home: Medicines  Take, use, or apply over-the-counter and prescription medicines only as told by your health care provider. These may include nasal sprays.  If you were prescribed an antibiotic medicine, take it as told by your health care provider. Do not stop taking the antibiotic even if you start to feel better. Hydrate and Humidify  Drink enough water to keep your urine clear or pale yellow. Staying hydrated will help to thin your mucus.  Use a cool mist humidifier to keep the humidity level in your home above 50%.  Inhale steam for 10-15 minutes, 3-4 times a day or as told by your health care provider. You can do this in the bathroom while a hot shower is running.  Limit your exposure to cool or dry air. Rest  Rest as much as possible.  Sleep with your head raised (elevated).  Make sure to get enough sleep each night. General instructions  Apply a warm, moist washcloth to your face 3-4 times a day or as told by your health care provider. This will help with discomfort.  Wash your hands often with soap and water to reduce your exposure to viruses and other germs. If soap and water are not available, use hand sanitizer.  Do not smoke. Avoid being around people who are smoking (secondhand smoke).  Keep all follow-up visits as told by your health care provider. This is important. Contact a  health care provider if:  You have a fever.  Your symptoms get worse.  Your symptoms do not improve within 10 days. Get help right away if:  You have a severe headache.  You have persistent vomiting.  You have pain or swelling around your face or eyes.  You have vision problems.  You develop confusion.  Your neck is stiff.  You have trouble breathing. This information is not intended to replace advice given to you by your health care provider. Make sure you discuss any questions you have with your health care provider. Document Released: 05/06/2005 Document Revised: 12/31/2015 Document Reviewed: 03/01/2015 Elsevier Interactive Patient Education  [employer].

## 2018-03-20 ENCOUNTER — Other Ambulatory Visit: Payer: Self-pay | Admitting: Family Medicine

## 2018-03-20 ENCOUNTER — Encounter: Payer: Self-pay | Admitting: *Deleted

## 2018-03-20 ENCOUNTER — Telehealth: Payer: Self-pay | Admitting: *Deleted

## 2018-03-20 NOTE — Telephone Encounter (Signed)
No vm.Rachana Malesky, Viann Shove, CMA  Was attempting to call pt to advise him that he MUST SCHEDULE AND KEEP APPOINTMENT FOR MEDICATION REFILLS, letter sent to pt advising him of this.Heath Gold, CMA

## 2018-03-27 ENCOUNTER — Other Ambulatory Visit: Payer: Self-pay | Admitting: Family Medicine

## 2018-04-24 ENCOUNTER — Other Ambulatory Visit: Payer: Self-pay

## 2018-04-24 ENCOUNTER — Telehealth: Payer: Self-pay | Admitting: Family Medicine

## 2018-04-24 MED ORDER — FLUOXETINE HCL 20 MG PO CAPS: 60.0000 mg | ORAL_CAPSULE | Freq: Every day | ORAL | 0 refills | Status: DC

## 2018-04-24 NOTE — Telephone Encounter (Signed)
Ms. Terry Vaughan called. Son is needing refill on his Prozac(none left)  he has scheduled an appointment for Dec 16th. Please contact Ms Terry Vaughan at [phone] or [phone])(her daughter's number) to let her know what has been decided.  Thanks!

## 2018-04-24 NOTE — Telephone Encounter (Signed)
Refilled enough to make it to the appointment. Patient's mom advised.

## 2018-04-24 NOTE — Telephone Encounter (Signed)
Ok perfect

## 2018-04-30 ENCOUNTER — Other Ambulatory Visit: Payer: Self-pay | Admitting: Family Medicine

## 2018-05-04 ENCOUNTER — Encounter: Payer: Self-pay | Admitting: Family Medicine

## 2018-05-04 ENCOUNTER — Ambulatory Visit: Payer: 59 | Admitting: Family Medicine

## 2018-05-04 VITALS — BP 125/68 | HR 60 | Ht 76.0 in | Wt 243.0 lb

## 2018-05-04 DIAGNOSIS — K2100 Gastro-esophageal reflux disease with esophagitis, without bleeding: Secondary | ICD-10-CM

## 2018-05-04 DIAGNOSIS — Z79899 Other long term (current) drug therapy: Secondary | ICD-10-CM

## 2018-05-04 DIAGNOSIS — K219 Gastro-esophageal reflux disease without esophagitis: Secondary | ICD-10-CM | POA: Diagnosis not present

## 2018-05-04 DIAGNOSIS — N401 Enlarged prostate with lower urinary tract symptoms: Secondary | ICD-10-CM | POA: Diagnosis not present

## 2018-05-04 MED ORDER — TAMSULOSIN HCL 0.4 MG PO CAPS: 0.4000 mg | ORAL_CAPSULE | Freq: Every day | ORAL | 1 refills | Status: DC

## 2018-05-04 MED ORDER — RAMELTEON 8 MG PO TABS: 8.0000 mg | ORAL_TABLET | Freq: Every day | ORAL | 3 refills | Status: DC

## 2018-05-04 MED ORDER — FLUOXETINE HCL 20 MG PO CAPS: 60.0000 mg | ORAL_CAPSULE | Freq: Every day | ORAL | 1 refills | Status: DC

## 2018-05-04 MED ORDER — PANTOPRAZOLE SODIUM 40 MG PO TBEC: 40.0000 mg | DELAYED_RELEASE_TABLET | Freq: Every day | ORAL | 3 refills | Status: DC

## 2018-05-04 NOTE — Progress Notes (Signed)
Subjective:    CC: Mood  HPI:  MMD -he is doing okay overall.  He feels that the fluoxetine is not working nearly as well as it used to he says that first year was so much better but now he feels like some of the Effexor waning.  He denies any increase or particular stressors at work or anything out of the norm.  He still really struggling with sleep quality.  He says he has a difficult time falling asleep and then staying asleep.  He tries to go to bed around 830 every night.  He does try to minimize screen time before bedtime.  He does not drink any caffeine.  He feels like his bedroom is conducive to sleep it is dark, cool and quiet.  BPH-taking his Flomax regularly.  He says he notices a big difference when he does not take it.  GERD-he has been out of his PPI and is been having more reflux symptoms he would like to have that refilled today.  Past medical history, Surgical history, Family history not pertinant except as noted below, Social history, Allergies, and medications have been entered into the medical record, reviewed, and corrections made.   Review of Systems: No fevers, chills, night sweats, weight loss, chest pain, or shortness of breath.   Objective:    General: Well Developed, well nourished, and in no acute distress.  Neuro: Alert and oriented x3, extra-ocular muscles intact, sensation grossly intact.  HEENT: Normocephalic, atraumatic  Skin: Warm and dry, no rashes. Cardiac: Regular rate and rhythm, no murmurs rubs or gallops, no lower extremity edema.  Respiratory: Clear to auscultation bilaterally. Not using accessory muscles, speaking in full sentences.   Impression and Recommendations:    MDD -we discussed options.  For now we will continue with fluoxetine 60 mg again to really try to focus on getting his sleep quality better and see if that helps with some of the mood related concerns that he is having.  PHQ 9 score of 11 today and gad 7 score of 9 rates his  symptoms is very difficult in regards to depression and somewhat difficult regards anxiety.  I will see him back in a couple months.  If at that point the improved sleep quality is not helping with his mood then we will probably try to increase his dose to 80 mg.  Insomnia-we will try to see if we can get Rozerem covered.  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  We were able to finally get it covered but at that point he was so frustrated he never went to pick it up.  We will go ahead and send a new prescription today.  BPH -doing well with Flomax.  Refill sent to pharmacy.  GERD -refilled PPI today.

## 2018-05-05 ENCOUNTER — Telehealth: Payer: Self-pay

## 2018-05-05 NOTE — Telephone Encounter (Signed)
[place], patient's mom, called and states the medication for sleep is the same one he has already tried in the past. It caused him to have dizziness. Please advise.

## 2018-05-05 NOTE — Telephone Encounter (Signed)
He said while he was here that he never actually filled the Remeron.  He says that initially I guess it required a PA from the insurance and he got frustrated and never actually took it.  If he feels like he did actually try it then we can always try something like trazodone.

## 2018-05-06 NOTE — Telephone Encounter (Signed)
Patient is not interested in taking the Trazodone.

## 2018-05-06 NOTE — Telephone Encounter (Signed)
Patient states he did try the medication because it made him feel sea sick, per his mom. She will call back after speaking with her son.

## 2018-05-15 DIAGNOSIS — Z79899 Other long term (current) drug therapy: Secondary | ICD-10-CM | POA: Diagnosis not present

## 2018-05-15 LAB — LIPID PANEL
CHOLESTEROL: 199 mg/dL (ref <200)
HDL: 41 mg/dL (ref >40)
LDL Cholesterol (Calc): 133 mg/dL (calc) — ABNORMAL HIGH
Non-HDL Cholesterol (Calc): 158 mg/dL (calc) — ABNORMAL HIGH (ref <130)
Total CHOL/HDL Ratio: 4.9 (calc) (ref <5.0)
Triglycerides: 139 mg/dL (ref <150)

## 2018-05-15 LAB — COMPLETE METABOLIC PANEL WITH GFR
AG Ratio: 1.6 (calc) (ref 1.0–2.5)
ALKALINE PHOSPHATASE (APISO): 80 U/L (ref 40–115)
ALT: 17 U/L (ref 9–46)
AST: 20 U/L (ref 10–35)
Albumin: 4 g/dL (ref 3.6–5.1)
BUN / CREAT RATIO: 10 (calc) (ref 6–22)
BUN: 14 mg/dL (ref 7–25)
CO2: 29 mmol/L (ref 20–32)
CREATININE: 1.36 mg/dL — AB (ref 0.70–1.33)
Calcium: 9.2 mg/dL (ref 8.6–10.3)
Chloride: 106 mmol/L (ref 98–110)
GFR, EST AFRICAN AMERICAN: 66 mL/min/{1.73_m2} (ref >=60)
GFR, Est Non African American: 57 mL/min/{1.73_m2} — ABNORMAL LOW (ref >=60)
GLUCOSE: 83 mg/dL (ref 65–99)
Globulin: 2.5 g/dL (calc) (ref 1.9–3.7)
Potassium: 4.2 mmol/L (ref 3.5–5.3)
Sodium: 142 mmol/L (ref 135–146)
TOTAL PROTEIN: 6.5 g/dL (ref 6.1–8.1)
Total Bilirubin: 0.7 mg/dL (ref 0.2–1.2)

## 2018-05-21 ENCOUNTER — Other Ambulatory Visit: Payer: Self-pay | Admitting: Family Medicine

## 2018-06-22 DIAGNOSIS — M7541 Impingement syndrome of right shoulder: Secondary | ICD-10-CM | POA: Diagnosis not present

## 2018-07-06 ENCOUNTER — Encounter: Payer: Self-pay | Admitting: Family Medicine

## 2018-07-06 ENCOUNTER — Ambulatory Visit: Payer: 59 | Admitting: Family Medicine

## 2018-07-06 VITALS — BP 137/77 | HR 83 | Wt 241.0 lb

## 2018-07-06 DIAGNOSIS — R51 Headache: Secondary | ICD-10-CM

## 2018-07-06 DIAGNOSIS — R519 Headache, unspecified: Secondary | ICD-10-CM

## 2018-07-06 DIAGNOSIS — F5101 Primary insomnia: Secondary | ICD-10-CM | POA: Diagnosis not present

## 2018-07-06 DIAGNOSIS — J309 Allergic rhinitis, unspecified: Secondary | ICD-10-CM | POA: Diagnosis not present

## 2018-07-06 DIAGNOSIS — F419 Anxiety disorder, unspecified: Secondary | ICD-10-CM

## 2018-07-06 DIAGNOSIS — F329 Major depressive disorder, single episode, unspecified: Secondary | ICD-10-CM

## 2018-07-06 DIAGNOSIS — F32A Depression, unspecified: Secondary | ICD-10-CM

## 2018-07-06 MED ORDER — FLUOXETINE HCL 40 MG PO CAPS: 80.0000 mg | ORAL_CAPSULE | Freq: Every day | ORAL | 0 refills | Status: DC

## 2018-07-06 MED ORDER — TRAZODONE HCL 50 MG PO TABS: 25.0000 mg | ORAL_TABLET | Freq: Every evening | ORAL | 3 refills | Status: DC | PRN

## 2018-07-06 NOTE — Progress Notes (Signed)
Subjective:    CC: Sleep   HPI:  Insomnia - the rozerem he has tried in the past and didn't do well with it.  He said it actually made him feel almost nauseated or seasick.  We had written for back in 2018 and I thought the insurance did not cover it.  Initially they did not but we were evidently able to finally get a prior authorization.  Is really struggling.  He says he wakes up almost every hour and it can take anywhere between 10 minutes to half an hour to fall back asleep but he does eventually fall back asleep.  He denies any snoring.  Anxiety/depression-he just feels like his anxiety has revved up.  Still feeling more anxious than usual.  He says it feels almost like he is starting to go back to how he felt before starting the fluoxetine.  He says it worked great for couple years but now he started to feel anxious just about getting up and going to work.  He is not completing tasks things just like a self hygiene and keeping his truck clean which normally he was super adamant about now he will just start and he will just feel tired and have quit not get back to it.  Also wanted to discuss that he is having frequent headaches.  He does have a history of migraines but also reports more recently he has been getting a lot of sinus pressure and even swelling in the turbinates he says he noticed that when it looks swollen inside of his nose will start to get headaches.  Also get a bump on his tongue that tends to pop up and that tells him he is going to get a headache.  He does use a nasal saline rinse twice a day he is not currently using any type of nasal steroid spray.  He has had prior ENT surgery  Past medical history, Surgical history, Family history not pertinant except as noted below, Social history, Allergies, and medications have been entered into the medical record, reviewed, and corrections made.   Review of Systems: No fevers, chills, night sweats, weight loss, chest pain, or shortness  of breath.   Objective:    General: Well Developed, well nourished, and in no acute distress.  Neuro: Alert and oriented x3, extra-ocular muscles intact, sensation grossly intact.  HEENT: Normocephalic, atraumatic oropharynx is clear, TMs and canals are clear bilaterally.  No significant cervical lymphadenopathy.  Concerned not significantly swollen today. Skin: Warm and dry, no rashes. Cardiac: Regular rate and rhythm, no murmurs rubs or gallops, no lower extremity edema.  Respiratory: Clear to auscultation bilaterally. Not using accessory muscles, speaking in full sentences.   Impression and Recommendations:    Insomnia -discussed options.  We could try trazodone.  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  He could certainly talk to them about whether or not he could take Ambien.  Anxiety/depression-we discussed options.  We could decrease his dose and add Wellbutrin, we could consider switching to Effexor or, he actually would opt to increase his fluoxetine to 80 mg daily which is the max dose.  If this is not effective after 1 month then we discussed coming back down on the medication and or switching.  Sinus headaches-it sounds like it could be related to his allergic rhinitis he does have a deviated septum on exam it sounds like he is getting concomitant headaches with nasal passage swelling  which is likely the turbinates based on his description.  Recommend a trial of Flonase he says he has used it before.  Encouraged him to restart that and give [place] a call if not helping after couple of weeks.

## 2018-08-03 ENCOUNTER — Ambulatory Visit: Payer: 59 | Admitting: Family Medicine

## 2018-09-07 ENCOUNTER — Other Ambulatory Visit: Payer: Self-pay | Admitting: Family Medicine

## 2018-09-07 DIAGNOSIS — F329 Major depressive disorder, single episode, unspecified: Secondary | ICD-10-CM

## 2018-09-07 DIAGNOSIS — F419 Anxiety disorder, unspecified: Principal | ICD-10-CM

## 2018-09-07 DIAGNOSIS — F32A Depression, unspecified: Secondary | ICD-10-CM

## 2018-10-05 ENCOUNTER — Other Ambulatory Visit: Payer: Self-pay | Admitting: Family Medicine

## 2018-10-05 DIAGNOSIS — F32A Depression, unspecified: Secondary | ICD-10-CM

## 2018-10-05 DIAGNOSIS — F329 Major depressive disorder, single episode, unspecified: Secondary | ICD-10-CM

## 2018-11-07 ENCOUNTER — Other Ambulatory Visit: Payer: Self-pay | Admitting: Family Medicine

## 2018-11-07 DIAGNOSIS — F32A Depression, unspecified: Secondary | ICD-10-CM

## 2018-11-07 DIAGNOSIS — F329 Major depressive disorder, single episode, unspecified: Secondary | ICD-10-CM

## 2018-12-08 ENCOUNTER — Other Ambulatory Visit: Payer: Self-pay | Admitting: Family Medicine

## 2018-12-08 DIAGNOSIS — F329 Major depressive disorder, single episode, unspecified: Secondary | ICD-10-CM

## 2018-12-08 DIAGNOSIS — F32A Depression, unspecified: Secondary | ICD-10-CM

## 2018-12-08 DIAGNOSIS — F419 Anxiety disorder, unspecified: Secondary | ICD-10-CM

## 2019-01-06 ENCOUNTER — Other Ambulatory Visit: Payer: Self-pay | Admitting: Family Medicine

## 2019-01-06 DIAGNOSIS — F419 Anxiety disorder, unspecified: Secondary | ICD-10-CM

## 2019-01-06 DIAGNOSIS — F329 Major depressive disorder, single episode, unspecified: Secondary | ICD-10-CM

## 2019-01-06 DIAGNOSIS — F32A Depression, unspecified: Secondary | ICD-10-CM

## 2019-02-05 ENCOUNTER — Other Ambulatory Visit: Payer: Self-pay | Admitting: Family Medicine

## 2019-02-05 DIAGNOSIS — F419 Anxiety disorder, unspecified: Secondary | ICD-10-CM

## 2019-02-05 DIAGNOSIS — F32A Depression, unspecified: Secondary | ICD-10-CM

## 2019-02-05 DIAGNOSIS — F329 Major depressive disorder, single episode, unspecified: Secondary | ICD-10-CM

## 2019-04-05 ENCOUNTER — Telehealth: Payer: Self-pay | Admitting: Family Medicine

## 2019-04-05 NOTE — Telephone Encounter (Signed)
That's fine. Please schedule, thanks!

## 2019-04-05 NOTE — Telephone Encounter (Signed)
Scheduled for tomorrow @1 :00.

## 2019-04-05 NOTE — Telephone Encounter (Signed)
I spoke with [place] in regards to her son Terry Vaughan. He is completely out of Fluoxetine. He needs to come in and be seen, but he has been working out of town and does not want to come into the office because of covid. Can I schedule a telephone visit for him. He hasnt been seen since March.

## 2019-04-06 ENCOUNTER — Encounter: Payer: Self-pay | Admitting: Family Medicine

## 2019-04-06 ENCOUNTER — Ambulatory Visit (INDEPENDENT_AMBULATORY_CARE_PROVIDER_SITE_OTHER): Payer: 59 | Admitting: Family Medicine

## 2019-04-06 VITALS — BP 116/70 | HR 66 | Temp 96.4°F | Wt 240.0 lb

## 2019-04-06 DIAGNOSIS — N1831 Chronic kidney disease, stage 3a: Secondary | ICD-10-CM

## 2019-04-06 DIAGNOSIS — K2100 Gastro-esophageal reflux disease with esophagitis, without bleeding: Secondary | ICD-10-CM

## 2019-04-06 DIAGNOSIS — E785 Hyperlipidemia, unspecified: Secondary | ICD-10-CM

## 2019-04-06 DIAGNOSIS — F32A Depression, unspecified: Secondary | ICD-10-CM

## 2019-04-06 DIAGNOSIS — F419 Anxiety disorder, unspecified: Secondary | ICD-10-CM

## 2019-04-06 DIAGNOSIS — F331 Major depressive disorder, recurrent, moderate: Secondary | ICD-10-CM

## 2019-04-06 DIAGNOSIS — F329 Major depressive disorder, single episode, unspecified: Secondary | ICD-10-CM

## 2019-04-06 DIAGNOSIS — F411 Generalized anxiety disorder: Secondary | ICD-10-CM | POA: Diagnosis not present

## 2019-04-06 DIAGNOSIS — N183 Chronic kidney disease, stage 3 unspecified: Secondary | ICD-10-CM | POA: Insufficient documentation

## 2019-04-06 MED ORDER — TRAZODONE HCL 50 MG PO TABS: 25.0000 mg | ORAL_TABLET | Freq: Every evening | ORAL | 1 refills | Status: DC | PRN

## 2019-04-06 MED ORDER — TAMSULOSIN HCL 0.4 MG PO CAPS: 0.4000 mg | ORAL_CAPSULE | Freq: Every day | ORAL | 1 refills | Status: DC

## 2019-04-06 MED ORDER — VENLAFAXINE HCL ER 37.5 MG PO CP24: ORAL_CAPSULE | ORAL | 1 refills | Status: DC

## 2019-04-06 MED ORDER — PANTOPRAZOLE SODIUM 40 MG PO TBEC: 40.0000 mg | DELAYED_RELEASE_TABLET | Freq: Every day | ORAL | 3 refills | Status: DC

## 2019-04-06 NOTE — Assessment & Plan Note (Signed)
Already off of fluoxetine.  We will try Effexor.  Follow-up in 4 to 6 weeks.

## 2019-04-06 NOTE — Progress Notes (Signed)
Pt reports that he doesn't feel that the Prozac didn't work well. He's been off of it x 1 month.Elouise Munroe, [place]

## 2019-04-06 NOTE — Assessment & Plan Note (Signed)
Due to recheck renal function.  Really need to check this every 6 months and we will also do a urine microalbumin.

## 2019-04-06 NOTE — Assessment & Plan Note (Signed)
Due to recheck lipid levels. 

## 2019-04-06 NOTE — Assessment & Plan Note (Signed)
Will refill pantoprazole

## 2019-04-06 NOTE — Progress Notes (Signed)
Virtual Visit via Telephone Note  I connected with ICHOLAS Vaughan on 04/06/19 at  1:00 PM EST by telephone and verified that I am speaking with the correct person using two identifiers.   I discussed the limitations, risks, security and privacy concerns of performing an evaluation and management service by telephone and the availability of in person appointments. I also discussed with the patient that there may be a patient responsible charge related to this service. The patient expressed understanding and agreed to proceed.    Established Patient Office Visit  Subjective:  Patient ID: ZYKEE Terry Vaughan, male    DOB: October 29, 1960  Age: 58 y.o. MRN: [id number]  CC:  Chief Complaint  Patient presents with  . mood    HPI Terry Vaughan presents for  F/U anxiety and depression - Has been more stressed.  His anxiety has been high.  NO sleeping as well.  Waking up frequently.  He says he ran out of the Prozac probably a little over a month ago and so just discontinued it.  When I saw him in February we had actually increase his dose to 80 mg which was the max because he felt like he was still having fairly high levels of anxiety though in the beginning it actually seemed to work really well for him.  We discussed maybe even transitioning to Effexor if the increased dosage was not helping.  Reflux GERD he would like a refill on his pantoprazole which she takes pretty regularly.  In regards to his sleep he did try the trazodone but says that a whole tab made him feel a little groggy and almost like he was on a boat.  So he started doing a half of a tab and says that actually worked pretty well.  He did not feel too sedated on that dosage and it seemed to be working but he also ran out of that medication as well.  Follow-up hyperlipidemia-not currently on a statin.    Past Medical History:  Diagnosis Date  . Kidney stone   . Perforated bowel ([place]) 10/2012    Past Surgical History:   Procedure Laterality Date  . CHOLECYSTECTOMY  09/2013   Dr. Janae Bridgeman, acute cholecystitis  . PARTIAL COLECTOMY  10/2012   Dr. Janae Bridgeman, perforated bowel  . PLEURAL SCARIFICATION      No family history on file.  Social History   Socioeconomic History  . Marital status: Divorced    Spouse name: Not on file  . Number of children: Not on file  . Years of education: Not on file  . Highest education level: Not on file  Occupational History  . Not on file  Social Needs  . Financial resource strain: Not on file  . Food insecurity    Worry: Not on file    Inability: Not on file  . Transportation needs    Medical: Not on file    Non-medical: Not on file  Tobacco Use  . Smoking status: Former Research scientist (life sciences)  . Smokeless tobacco: Never Used  Substance and Sexual Activity  . Alcohol use: No  . Drug use: No  . Sexual activity: Not on file  Lifestyle  . Physical activity    Days per week: Not on file    Minutes per session: Not on file  . Stress: Not on file  Relationships  . Social Herbalist on phone: Not on file    Gets together: Not on file    Attends  religious service: Not on file    Active member of club or organization: Not on file    Attends meetings of clubs or organizations: Not on file    Relationship status: Not on file  . Intimate partner violence    Fear of current or ex partner: Not on file    Emotionally abused: Not on file    Physically abused: Not on file    Forced sexual activity: Not on file  Other Topics Concern  . Not on file  Social History Narrative  . Not on file    Outpatient Medications Prior to Visit  Medication Sig Dispense Refill  . Cetirizine HCl (ZYRTEC PO) Take by mouth.    . pantoprazole (PROTONIX) 40 MG tablet Take 1 tablet (40 mg total) by mouth daily. 90 tablet 3  . tamsulosin (FLOMAX) 0.4 MG CAPS capsule TAKE ONE CAPSULE BY MOUTH DAILY 90 capsule 1  . FLUoxetine (PROZAC) 40 MG capsule Take 2 capsules (80 mg total) by mouth  daily. 30 DAY SUPPLY GIVEN.MUST SCHEDULE/KEEP APPOINTMENT FOR REFILLS (Patient not taking: Reported on 04/06/2019) 60 capsule 0  . traZODone (DESYREL) 50 MG tablet Take 0.5-1.5 tablets (25-75 mg total) by mouth at bedtime as needed for sleep. (Patient not taking: Reported on 04/06/2019) 30 tablet 3   No facility-administered medications prior to visit.     Allergies  Allergen Reactions  . Rozerem [Ramelteon] Other (See Comments)    dizziness  . Meperidine Hcl Nausea Only  . Morphine And Related Nausea Only  . Promethazine Hcl     Other reaction(s): Confusion    ROS Review of Systems    Objective:    Physical Exam  BP 116/70   Pulse 66   Temp (!) 96.4 F (35.8 C)   Wt 240 lb (108.9 kg)   BMI 29.21 kg/m  Wt Readings from Last 3 Encounters:  04/06/19 240 lb (108.9 kg)  07/06/18 241 lb (109.3 kg)  05/04/18 243 lb (110.2 kg)     There are no preventive care reminders to display for this patient.  There are no preventive care reminders to display for this patient.  Lab Results  Component Value Date   TSH 1.30 08/29/2015   Lab Results  Component Value Date   WBC 5.7 12/02/2017   HGB 13.8 12/02/2017   HCT 40.7 12/02/2017   MCV 91.5 12/02/2017   PLT 254 12/02/2017   Lab Results  Component Value Date   NA 142 05/15/2018   K 4.2 05/15/2018   CO2 29 05/15/2018   GLUCOSE 83 05/15/2018   BUN 14 05/15/2018   CREATININE 1.36 (H) 05/15/2018   BILITOT 0.7 05/15/2018   ALKPHOS 89 06/29/2016   AST 20 05/15/2018   ALT 17 05/15/2018   PROT 6.5 05/15/2018   ALBUMIN 4.3 06/29/2016   CALCIUM 9.2 05/15/2018   Lab Results  Component Value Date   CHOL 199 05/15/2018   Lab Results  Component Value Date   HDL 41 05/15/2018   Lab Results  Component Value Date   LDLCALC 133 (H) 05/15/2018   Lab Results  Component Value Date   TRIG 139 05/15/2018   Lab Results  Component Value Date   CHOLHDL 4.9 05/15/2018   Lab Results  Component Value Date   HGBA1C 5.3  08/29/2015      Assessment & Plan:   Problem List Items Addressed This Visit      Digestive   Gastroesophageal reflux disease with esophagitis    Will refill  pantoprazole.      Relevant Medications   pantoprazole (PROTONIX) 40 MG tablet     Genitourinary   CKD (chronic kidney disease) stage 3, GFR 30-59 ml/min    Due to recheck renal function.  Really need to check this every 6 months and we will also do a urine microalbumin.      Relevant Orders   COMPLETE METABOLIC PANEL WITH GFR   Urine Microalbumin w/creat. ratio     Other   Major depressive disorder, recurrent episode (HCC)   Relevant Medications   traZODone (DESYREL) 50 MG tablet   venlafaxine XR (EFFEXOR XR) 37.5 MG 24 hr capsule   Hyperlipidemia    Due to recheck lipid levels.      Relevant Orders   Lipid panel   GAD (generalized anxiety disorder) - Primary    Already off of fluoxetine.  We will try Effexor.  Follow-up in 4 to 6 weeks.      Relevant Medications   traZODone (DESYREL) 50 MG tablet   venlafaxine XR (EFFEXOR XR) 37.5 MG 24 hr capsule    Other Visit Diagnoses    Anxiety and depression       Relevant Medications   traZODone (DESYREL) 50 MG tablet   venlafaxine XR (EFFEXOR XR) 37.5 MG 24 hr capsule      Meds ordered this encounter  Medications  . traZODone (DESYREL) 50 MG tablet    Sig: Take 0.5-1 tablets (25-50 mg total) by mouth at bedtime as needed for sleep.    Dispense:  90 tablet    Refill:  1  . pantoprazole (PROTONIX) 40 MG tablet    Sig: Take 1 tablet (40 mg total) by mouth daily.    Dispense:  90 tablet    Refill:  3  . tamsulosin (FLOMAX) 0.4 MG CAPS capsule    Sig: Take 1 capsule (0.4 mg total) by mouth daily.    Dispense:  90 capsule    Refill:  1  . venlafaxine XR (EFFEXOR XR) 37.5 MG 24 hr capsule    Sig: 1 tab po Qd x 10 days, then increase to 2 tabs QD    Dispense:  60 capsule    Refill:  1    Follow-up: Return in about 4 weeks (around 05/04/2019) for telephone  visit for mood/new start med.     I discussed the assessment and treatment plan with the patient. The patient was provided an opportunity to ask questions and all were answered. The patient agreed with the plan and demonstrated an understanding of the instructions.   The patient was advised to call back or seek an in-person evaluation if the symptoms worsen or if the condition fails to improve as anticipated.  I provided 25 minutes of non-face-to-face time during this encounter.    Nani Gasseratherine Hardie Veltre, MD

## 2019-04-12 ENCOUNTER — Telehealth: Payer: Self-pay | Admitting: Family Medicine

## 2019-04-12 NOTE — Telephone Encounter (Signed)
Patient called and wanted to know if our office was doing a rapid test for Covid and I advised him that we were not. He declined a virtual visit. He is going to check around and see if [facility] is doing rapid testing at this time or another office. No other questions.

## 2019-04-13 ENCOUNTER — Encounter: Payer: Self-pay | Admitting: Family Medicine

## 2019-04-13 ENCOUNTER — Ambulatory Visit (INDEPENDENT_AMBULATORY_CARE_PROVIDER_SITE_OTHER): Payer: 59 | Admitting: Family Medicine

## 2019-04-13 VITALS — Ht 76.0 in

## 2019-04-13 DIAGNOSIS — Z5329 Procedure and treatment not carried out because of patient's decision for other reasons: Secondary | ICD-10-CM

## 2019-04-13 NOTE — Progress Notes (Signed)
No show

## 2019-04-13 NOTE — Progress Notes (Signed)
Called pt no answer or vm set up.Audelia Hives [place], CMA\  Called pt again there was no answer/vm.Elouise Munroe, [place]

## 2019-04-14 ENCOUNTER — Telehealth: Payer: Self-pay

## 2019-04-14 NOTE — Telephone Encounter (Signed)
[facility] COVID-19 test came back negative.

## 2019-05-01 LAB — COMPLETE METABOLIC PANEL WITH GFR
AG Ratio: 1.7 (calc) (ref 1.0–2.5)
ALT: 15 U/L (ref 9–46)
AST: 14 U/L (ref 10–35)
Albumin: 4 g/dL (ref 3.6–5.1)
Alkaline phosphatase (APISO): 82 U/L (ref 35–144)
BUN/Creatinine Ratio: 12 (calc) (ref 6–22)
BUN: 17 mg/dL (ref 7–25)
CO2: 26 mmol/L (ref 20–32)
Calcium: 9.1 mg/dL (ref 8.6–10.3)
Chloride: 109 mmol/L (ref 98–110)
Creat: 1.4 mg/dL — ABNORMAL HIGH (ref 0.70–1.33)
GFR, Est African American: 63 mL/min/{1.73_m2} (ref >=60)
GFR, Est Non African American: 55 mL/min/{1.73_m2} — ABNORMAL LOW (ref >=60)
Globulin: 2.4 g/dL (calc) (ref 1.9–3.7)
Glucose, Bld: 84 mg/dL (ref 65–99)
Potassium: 3.9 mmol/L (ref 3.5–5.3)
Sodium: 145 mmol/L (ref 135–146)
Total Bilirubin: 0.6 mg/dL (ref 0.2–1.2)
Total Protein: 6.4 g/dL (ref 6.1–8.1)

## 2019-05-01 LAB — MICROALBUMIN / CREATININE URINE RATIO
Creatinine, Urine: 182 mg/dL (ref 20–320)
Microalb Creat Ratio: 5 mcg/mg creat (ref <30)
Microalb, Ur: 0.9 mg/dL

## 2019-05-01 LAB — LIPID PANEL
Cholesterol: 220 mg/dL — ABNORMAL HIGH (ref <200)
HDL: 40 mg/dL (ref >=40)
LDL Cholesterol (Calc): 158 mg/dL (calc) — ABNORMAL HIGH
Non-HDL Cholesterol (Calc): 180 mg/dL (calc) — ABNORMAL HIGH (ref <130)
Total CHOL/HDL Ratio: 5.5 (calc) — ABNORMAL HIGH (ref <5.0)
Triglycerides: 102 mg/dL (ref <150)

## 2019-05-04 ENCOUNTER — Ambulatory Visit: Payer: 59 | Admitting: Family Medicine

## 2019-05-06 ENCOUNTER — Other Ambulatory Visit: Payer: Self-pay | Admitting: Family Medicine

## 2019-07-30 ENCOUNTER — Other Ambulatory Visit: Payer: Self-pay | Admitting: Family Medicine

## 2019-07-30 DIAGNOSIS — K2100 Gastro-esophageal reflux disease with esophagitis, without bleeding: Secondary | ICD-10-CM

## 2019-08-05 ENCOUNTER — Other Ambulatory Visit: Payer: Self-pay | Admitting: *Deleted

## 2019-08-05 DIAGNOSIS — F32A Depression, unspecified: Secondary | ICD-10-CM

## 2019-08-05 DIAGNOSIS — F419 Anxiety disorder, unspecified: Secondary | ICD-10-CM

## 2019-08-05 DIAGNOSIS — F411 Generalized anxiety disorder: Secondary | ICD-10-CM

## 2019-08-05 DIAGNOSIS — F329 Major depressive disorder, single episode, unspecified: Secondary | ICD-10-CM

## 2019-08-05 MED ORDER — VENLAFAXINE HCL ER 37.5 MG PO CP24: 75.0000 mg | ORAL_CAPSULE | Freq: Every day | ORAL | 0 refills | Status: DC

## 2019-10-07 ENCOUNTER — Other Ambulatory Visit: Payer: Self-pay

## 2019-10-07 DIAGNOSIS — F419 Anxiety disorder, unspecified: Secondary | ICD-10-CM

## 2019-10-07 DIAGNOSIS — F32A Depression, unspecified: Secondary | ICD-10-CM

## 2019-10-07 DIAGNOSIS — F411 Generalized anxiety disorder: Secondary | ICD-10-CM

## 2019-10-07 MED ORDER — VENLAFAXINE HCL ER 37.5 MG PO CP24: 75.0000 mg | ORAL_CAPSULE | Freq: Every day | ORAL | 0 refills | Status: DC

## 2019-10-14 ENCOUNTER — Ambulatory Visit (INDEPENDENT_AMBULATORY_CARE_PROVIDER_SITE_OTHER): Payer: 59 | Admitting: Family Medicine

## 2019-10-14 ENCOUNTER — Encounter: Payer: Self-pay | Admitting: Family Medicine

## 2019-10-14 DIAGNOSIS — K2100 Gastro-esophageal reflux disease with esophagitis, without bleeding: Secondary | ICD-10-CM

## 2019-10-14 DIAGNOSIS — F331 Major depressive disorder, recurrent, moderate: Secondary | ICD-10-CM

## 2019-10-14 DIAGNOSIS — F419 Anxiety disorder, unspecified: Secondary | ICD-10-CM

## 2019-10-14 DIAGNOSIS — F32A Depression, unspecified: Secondary | ICD-10-CM

## 2019-10-14 DIAGNOSIS — F411 Generalized anxiety disorder: Secondary | ICD-10-CM | POA: Diagnosis not present

## 2019-10-14 DIAGNOSIS — F329 Major depressive disorder, single episode, unspecified: Secondary | ICD-10-CM

## 2019-10-14 MED ORDER — VENLAFAXINE HCL ER 75 MG PO CP24: 75.0000 mg | ORAL_CAPSULE | Freq: Every day | ORAL | 1 refills | Status: DC

## 2019-10-14 MED ORDER — PANTOPRAZOLE SODIUM 40 MG PO TBEC: 40.0000 mg | DELAYED_RELEASE_TABLET | Freq: Every day | ORAL | 3 refills | Status: DC

## 2019-10-14 MED ORDER — TAMSULOSIN HCL 0.4 MG PO CAPS: 0.4000 mg | ORAL_CAPSULE | Freq: Every day | ORAL | 3 refills | Status: DC

## 2019-10-14 NOTE — Assessment & Plan Note (Signed)
sxs not maximally controlled. Change to 75mg . F/U in 6 mo

## 2019-10-14 NOTE — Assessment & Plan Note (Signed)
Change to 75mg  so doesn't have to take 2 tabs and for more consistant dosing. Says had tried 75 before nad didn't feel right but so far doing well on this dose.

## 2019-10-14 NOTE — Progress Notes (Signed)
Established Patient Office Visit  Subjective:  Patient ID: Terry Vaughan, male    DOB: 1960-06-16  Age: 59 y.o. MRN: [id number]  CC:  Chief Complaint  Patient presents with  . Anxiety  . Gastroesophageal Reflux    HPI Terry Vaughan presents for doing ok with anxiety.  Says if he misses a dose he feels worse.  He has only been taking one tab a day up until about a week ago when he went up on his medication.   GERD - doing well on regimne.  Would like RF.   Past Medical History:  Diagnosis Date  . Kidney stone   . Perforated bowel ([place]) 10/2012    Past Surgical History:  Procedure Laterality Date  . CHOLECYSTECTOMY  09/2013   Dr. Janae Bridgeman, acute cholecystitis  . PARTIAL COLECTOMY  10/2012   Dr. Janae Bridgeman, perforated bowel  . PLEURAL SCARIFICATION      No family history on file.  Social History   Socioeconomic History  . Marital status: Divorced    Spouse name: Not on file  . Number of children: Not on file  . Years of education: Not on file  . Highest education level: Not on file  Occupational History  . Not on file  Tobacco Use  . Smoking status: Former Research scientist (life sciences)  . Smokeless tobacco: Never Used  Substance and Sexual Activity  . Alcohol use: No  . Drug use: No  . Sexual activity: Not on file  Other Topics Concern  . Not on file  Social History Narrative  . Not on file   Social Determinants of Health   Financial Resource Strain:   . Difficulty of Paying Living Expenses:   Food Insecurity:   . Worried About Charity fundraiser in the Last Year:   . Arboriculturist in the Last Year:   Transportation Needs:   . Film/video editor (Medical):   Marland Kitchen Lack of Transportation (Non-Medical):   Physical Activity:   . Days of Exercise per Week:   . Minutes of Exercise per Session:   Stress:   . Feeling of Stress :   Social Connections:   . Frequency of Communication with Friends and Family:   . Frequency of Social Gatherings with Friends and Family:   .  Attends Religious Services:   . Active Member of Clubs or Organizations:   . Attends Archivist Meetings:   Marland Kitchen Marital Status:   Intimate Partner Violence:   . Fear of Current or Ex-Partner:   . Emotionally Abused:   Marland Kitchen Physically Abused:   . Sexually Abused:     Outpatient Medications Prior to Visit  Medication Sig Dispense Refill  . Cetirizine HCl (ZYRTEC PO) Take by mouth.    . pantoprazole (PROTONIX) 40 MG tablet Take 1 tablet (40 mg total) by mouth daily. NEEDS APPOINTMENT 90 tablet 0  . tamsulosin (FLOMAX) 0.4 MG CAPS capsule Take 1 capsule (0.4 mg total) by mouth daily. 90 capsule 1  . traZODone (DESYREL) 50 MG tablet Take 0.5-1 tablets (25-50 mg total) by mouth at bedtime as needed for sleep. 90 tablet 1  . traZODone (DESYREL) 50 MG tablet SMARTSIG:1 Tablet(s) By Mouth Every Night    . venlafaxine XR (EFFEXOR XR) 37.5 MG 24 hr capsule Take 2 capsules (75 mg total) by mouth daily. [place].MUST KEEP APPOINTMENT 60 capsule 0   No facility-administered medications prior to visit.    Allergies  Allergen Reactions  .  Rozerem [Ramelteon] Other (See Comments)    dizziness  . Meperidine Hcl Nausea Only  . Morphine And Related Nausea Only  . Promethazine Hcl     Other reaction(s): Confusion    ROS Review of Systems    Objective:    Physical Exam  Constitutional: He is oriented to person, place, and time. He appears well-developed and well-nourished.  HENT:  Head: Normocephalic and atraumatic.  Cardiovascular: Normal rate, regular rhythm and normal heart sounds.  Pulmonary/Chest: Effort normal and breath sounds normal.  Neurological: He is alert and oriented to person, place, and time.  Skin: Skin is warm and dry.  Psychiatric: He has a normal mood and affect. His behavior is normal.    BP 131/81   Pulse 72   Ht 6\' 4"  (1.93 m)   Wt 251 lb (113.9 kg)   SpO2 99%   BMI 30.55 kg/m  Wt Readings from Last 3 Encounters:  10/14/19 251 lb (113.9 kg)   04/06/19 240 lb (108.9 kg)  07/06/18 241 lb (109.3 kg)     Health Maintenance Due  Topic Date Due  . COVID-19 Vaccine (1) Never done    There are no preventive care reminders to display for this patient.  Lab Results  Component Value Date   TSH 1.30 08/29/2015   Lab Results  Component Value Date   WBC 5.7 12/02/2017   HGB 13.8 12/02/2017   HCT 40.7 12/02/2017   MCV 91.5 12/02/2017   PLT 254 12/02/2017   Lab Results  Component Value Date   NA 145 04/30/2019   K 3.9 04/30/2019   CO2 26 04/30/2019   GLUCOSE 84 04/30/2019   BUN 17 04/30/2019   CREATININE 1.40 (H) 04/30/2019   BILITOT 0.6 04/30/2019   ALKPHOS 89 06/29/2016   AST 14 04/30/2019   ALT 15 04/30/2019   PROT 6.4 04/30/2019   ALBUMIN 4.3 06/29/2016   CALCIUM 9.1 04/30/2019   Lab Results  Component Value Date   CHOL 220 (H) 04/30/2019   Lab Results  Component Value Date   HDL 40 04/30/2019   Lab Results  Component Value Date   LDLCALC 158 (H) 04/30/2019   Lab Results  Component Value Date   TRIG 102 04/30/2019   Lab Results  Component Value Date   CHOLHDL 5.5 (H) 04/30/2019   Lab Results  Component Value Date   HGBA1C 5.3 08/29/2015      Assessment & Plan:   Problem List Items Addressed This Visit      Digestive   Gastroesophageal reflux disease with esophagitis    Well controlled. Continue current regimen. Follow up in  6 mo      Relevant Medications   pantoprazole (PROTONIX) 40 MG tablet     Other   Major depressive disorder, recurrent episode (HCC)    sxs not maximally controlled. Change to 75mg . F/U in 6 mo      Relevant Medications   venlafaxine XR (EFFEXOR XR) 75 MG 24 hr capsule   GAD (generalized anxiety disorder)    Change to 75mg  so doesn't have to take 2 tabs and for more consistant dosing. Says had tried 75 before nad didn't feel right but so far doing well on this dose.       Relevant Medications   venlafaxine XR (EFFEXOR XR) 75 MG 24 hr capsule    Other  Visit Diagnoses    Anxiety and depression       Relevant Medications   venlafaxine XR (EFFEXOR XR)  75 MG 24 hr capsule      Meds ordered this encounter  Medications  . venlafaxine XR (EFFEXOR XR) 75 MG 24 hr capsule    Sig: Take 1 capsule (75 mg total) by mouth daily.    Dispense:  90 capsule    Refill:  1  . pantoprazole (PROTONIX) 40 MG tablet    Sig: Take 1 tablet (40 mg total) by mouth daily.    Dispense:  90 tablet    Refill:  3  . tamsulosin (FLOMAX) 0.4 MG CAPS capsule    Sig: Take 1 capsule (0.4 mg total) by mouth daily.    Dispense:  90 capsule    Refill:  3    Follow-up: Return for Mood.    Nani Gasser, MD

## 2019-10-14 NOTE — Assessment & Plan Note (Signed)
Well controlled. Continue current regimen. Follow up in  6 mo  

## 2019-11-19 ENCOUNTER — Other Ambulatory Visit: Payer: Self-pay | Admitting: Orthopedic Surgery

## 2019-11-19 DIAGNOSIS — M25511 Pain in right shoulder: Secondary | ICD-10-CM

## 2019-12-17 ENCOUNTER — Ambulatory Visit
Admission: RE | Admit: 2019-12-17 | Discharge: 2019-12-17 | Disposition: A | Discharge status: Home / Self Care | Payer: Worker's Compensation | Source: Ambulatory Visit | Attending: Orthopedic Surgery | Admitting: Orthopedic Surgery

## 2019-12-17 ENCOUNTER — Other Ambulatory Visit: Payer: Self-pay

## 2019-12-17 DIAGNOSIS — M25511 Pain in right shoulder: Secondary | ICD-10-CM

## 2020-01-20 IMAGING — DX DG CHEST 2V
2 series · 2 of 2 positions shown · non-contrast
Comparison: July 25, 2011

CLINICAL DATA: Cough for 3 weeks

EXAM:
CHEST - 2 VIEW

[chest pa]
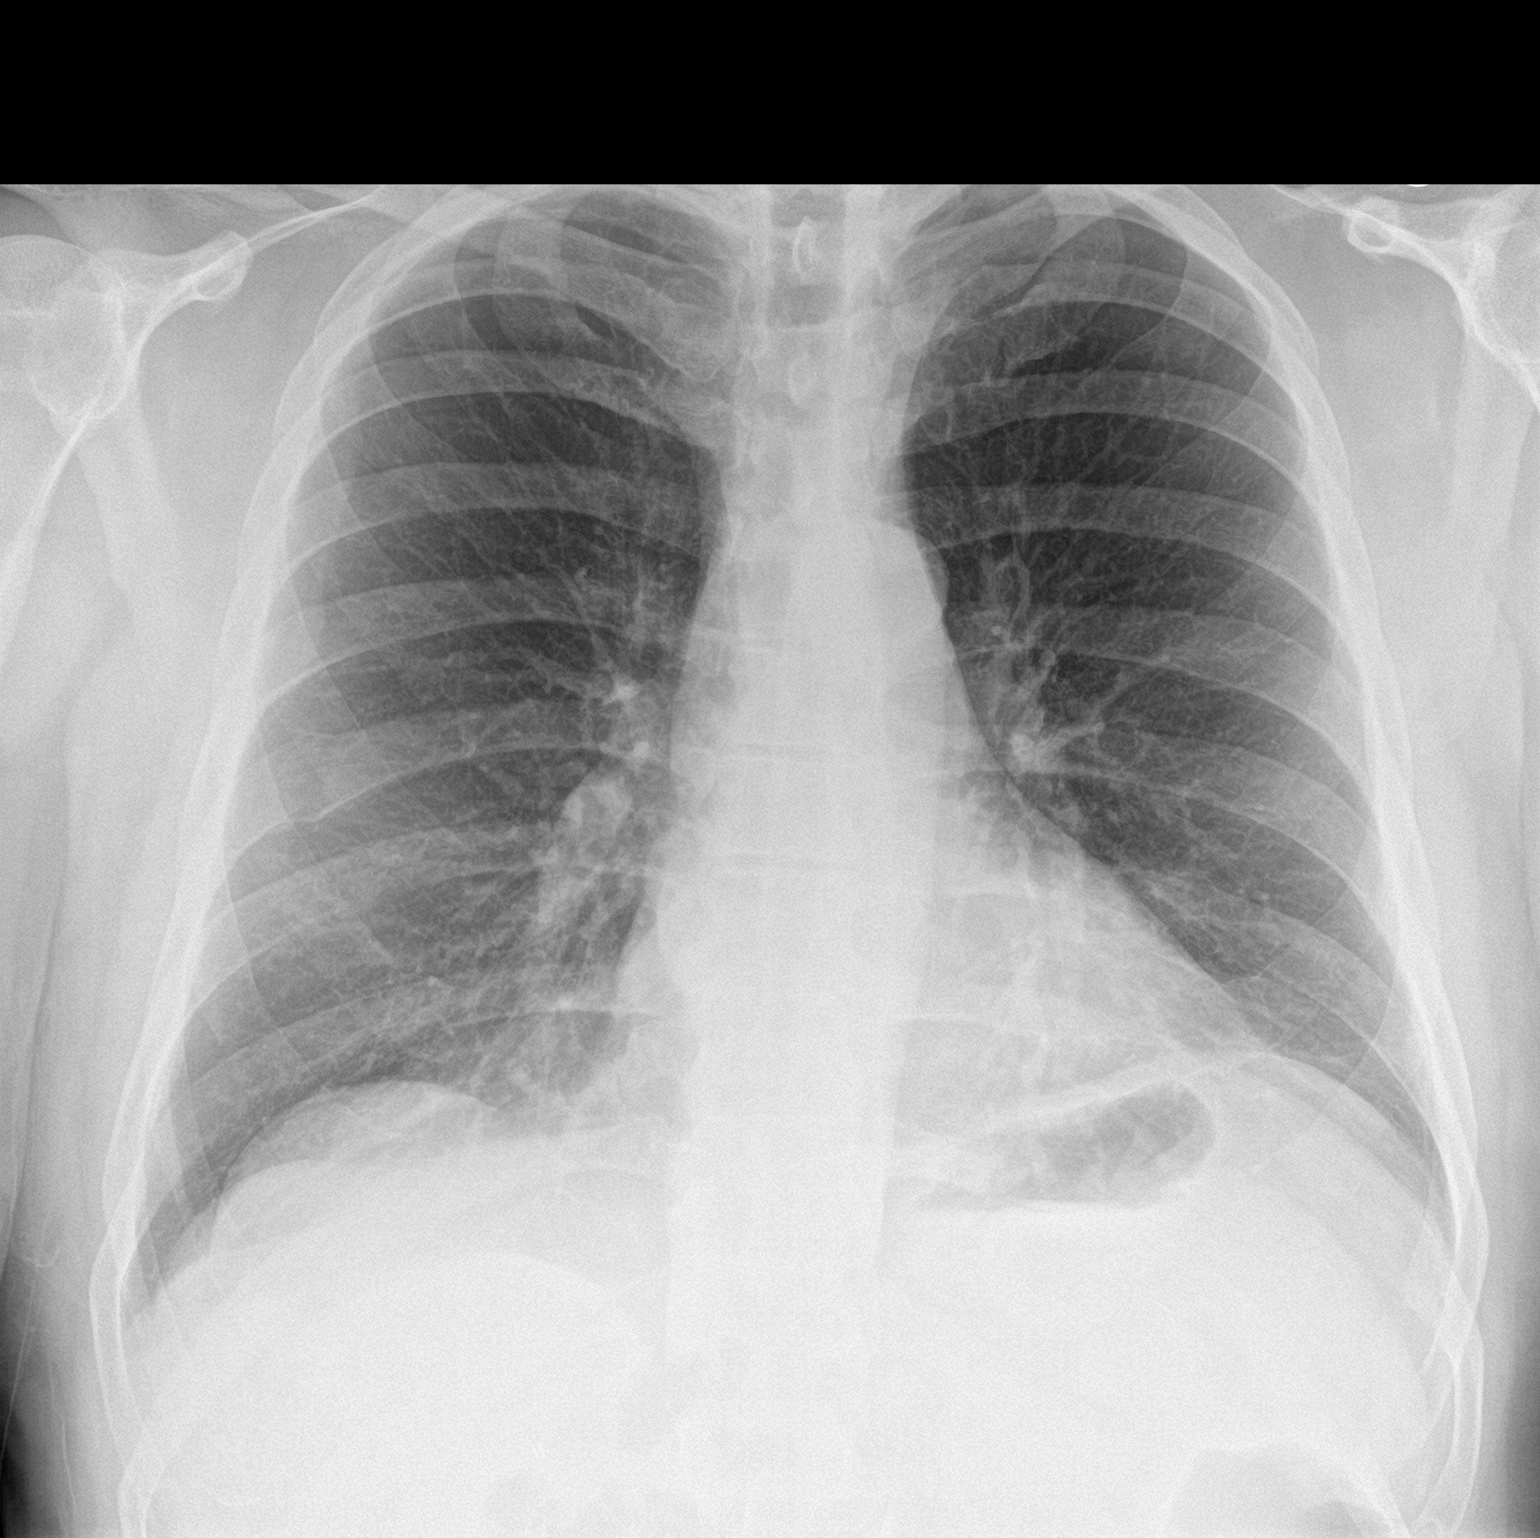

[chest lat]
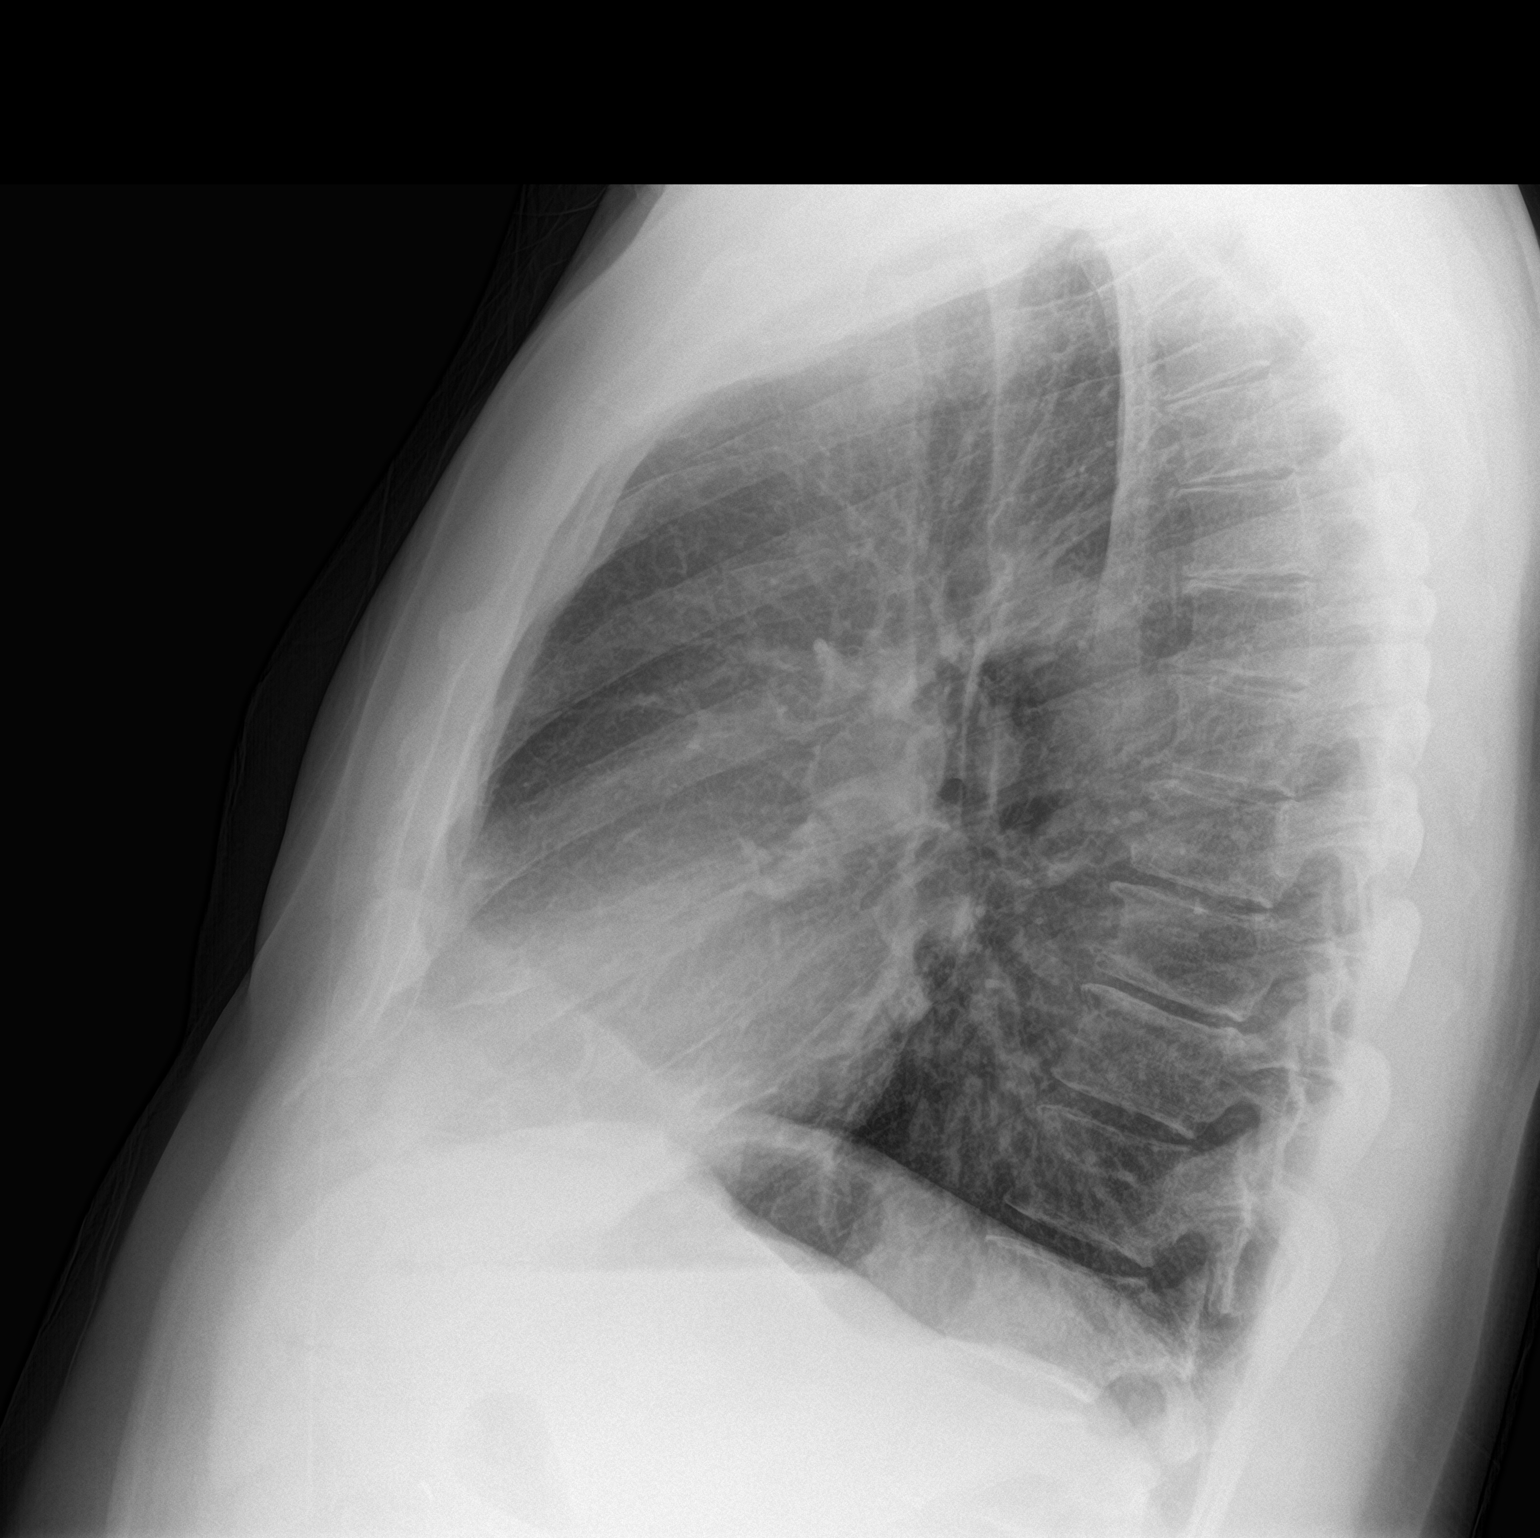

[2 of 2 positions shown; findings below may reference images not displayed]

FINDINGS: There is no edema or consolidation. Heart size and pulmonary
vascularity are normal. No adenopathy. There is an old healed
fracture of the right posterior eighth rib.
IMPRESSION: No edema or consolidation.

## 2020-02-17 ENCOUNTER — Telehealth: Payer: Self-pay | Admitting: Neurology

## 2020-02-17 IMAGING — DX DG CHEST 2V
2 series · 2 of 2 positions shown · non-contrast
Comparison: 11/04/2017

CLINICAL DATA: Cough and shortness of breath for 1 month, history
of GERD, kidney stones, asthma, former smoker, cholecystectomy

EXAM:
CHEST - 2 VIEW

[chest pa]
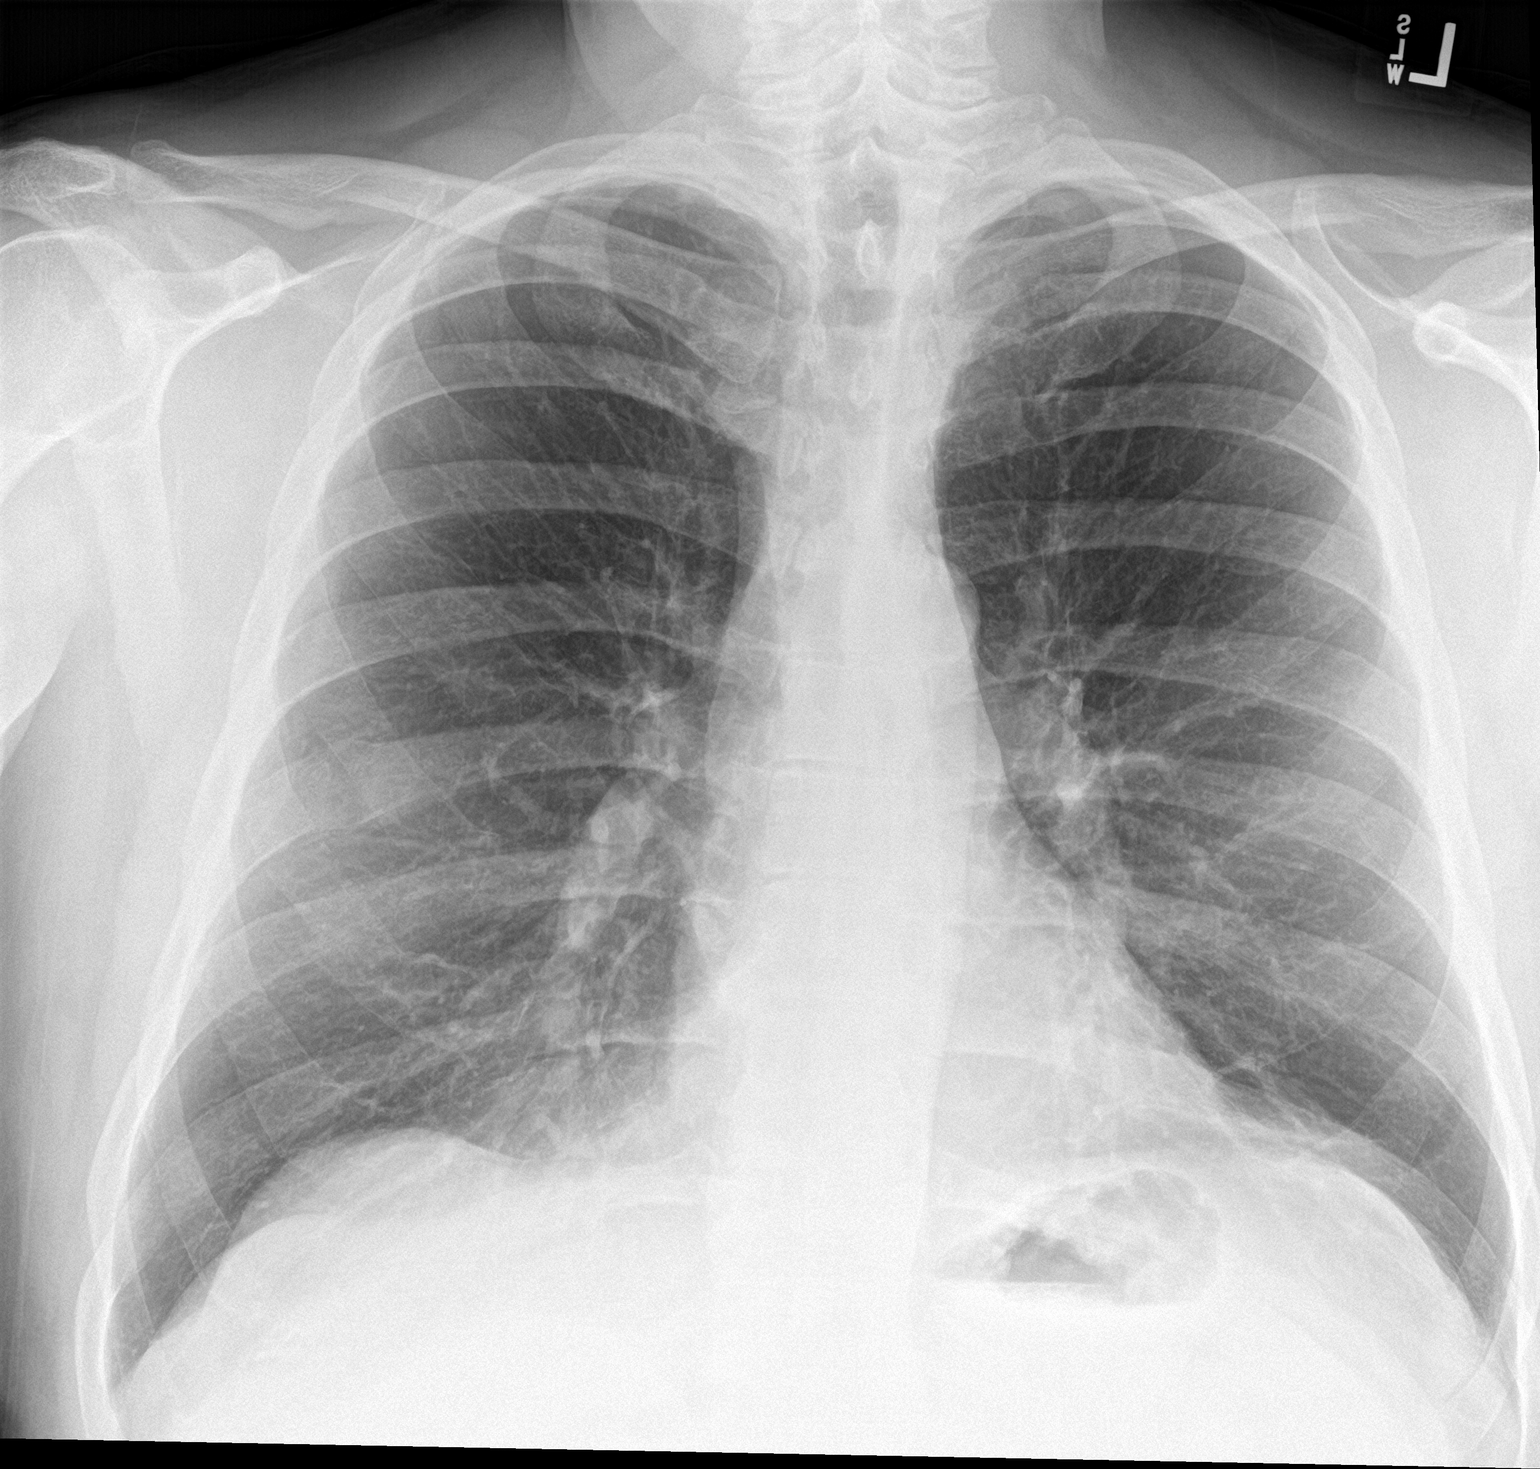

[chest lat]
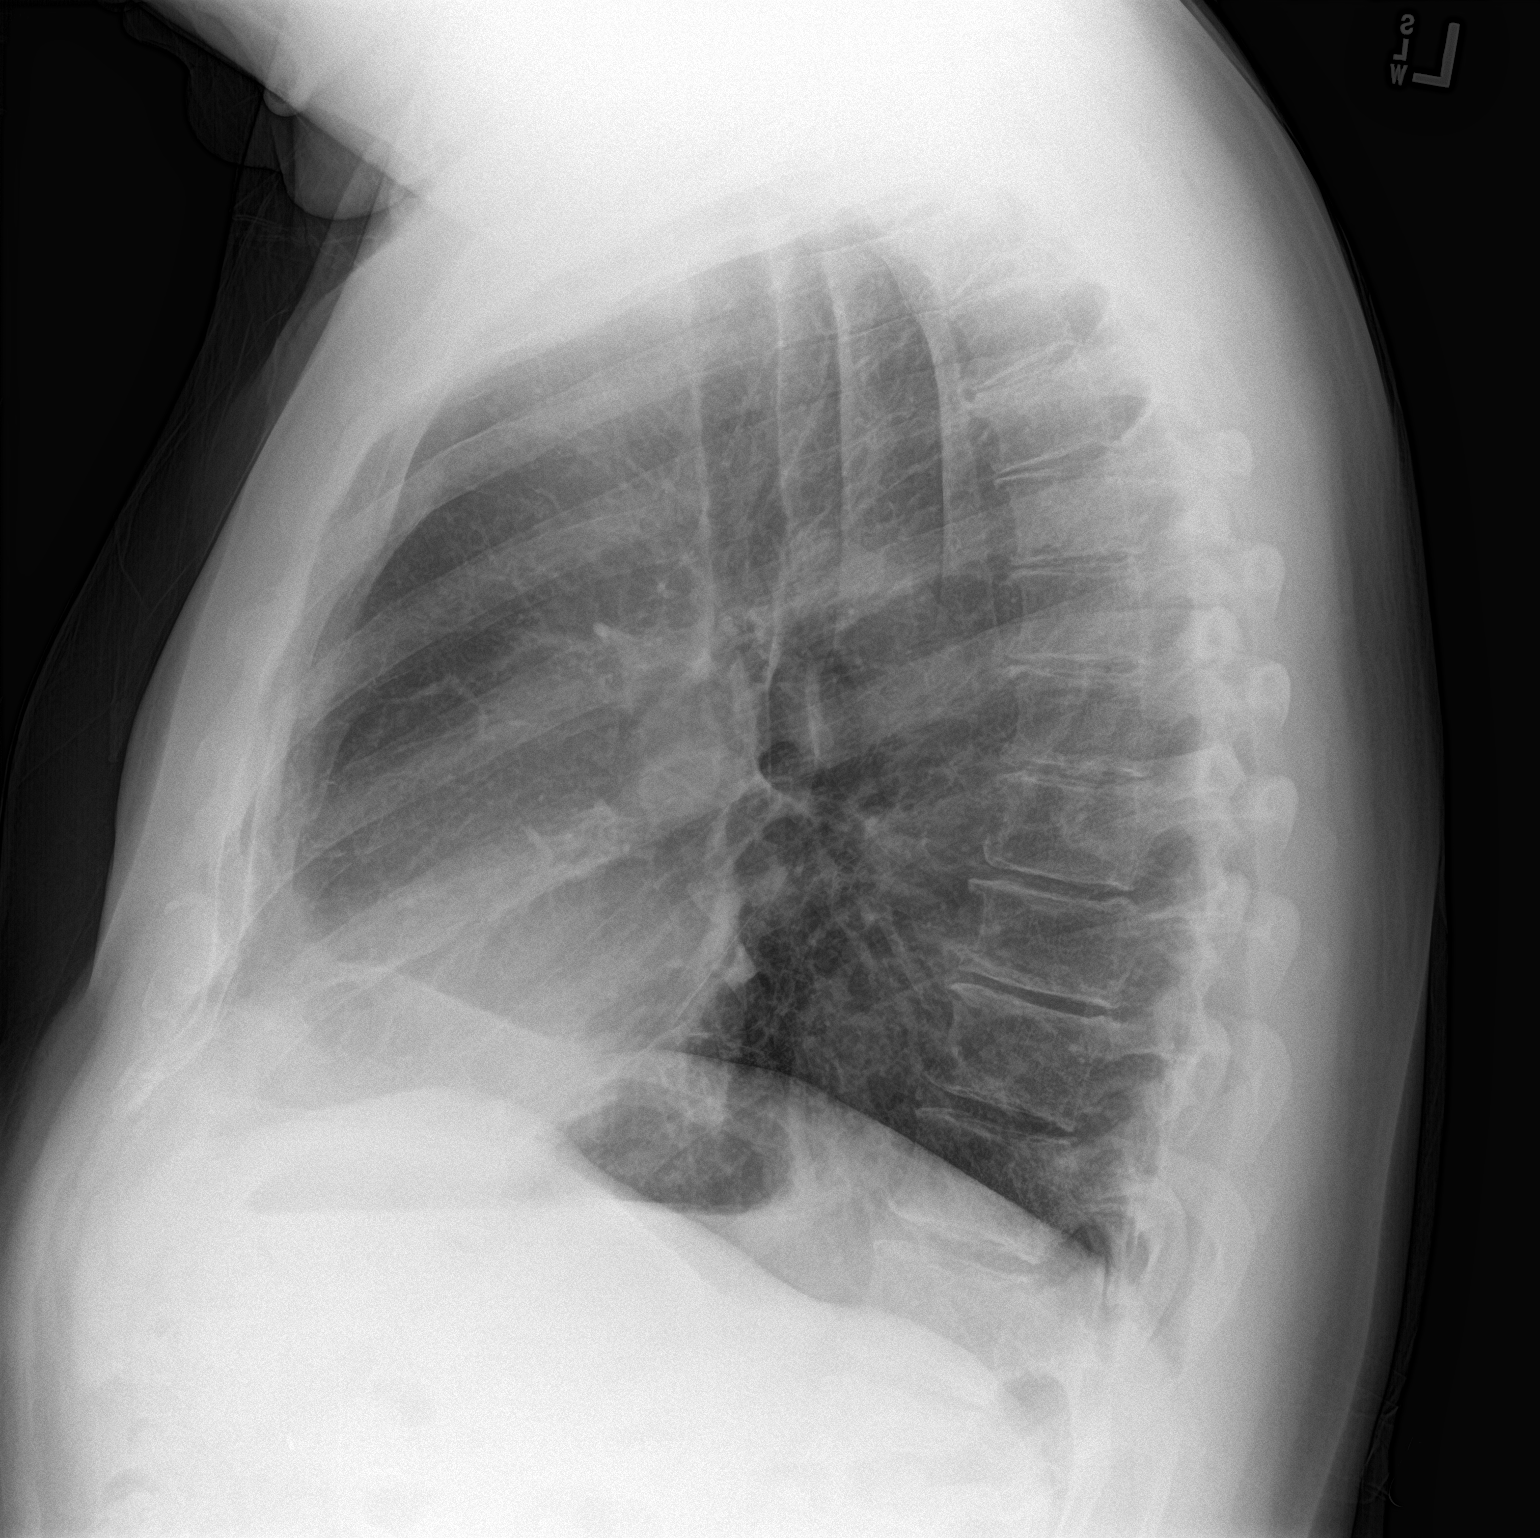

[2 of 2 positions shown; findings below may reference images not displayed]

FINDINGS: Normal heart size, mediastinal contours, and pulmonary vascularity.

Minimal chronic LEFT basilar atelectasis.

Lungs otherwise clear.

No pulmonary infiltrate, pleural effusion or pneumothorax.

Bones unremarkable.
IMPRESSION: Minimal chronic LEFT basilar atelectasis.

Otherwise negative exam.

## 2020-02-17 NOTE — Telephone Encounter (Signed)
Protonix PA sent through covermymeds.   This request has received a Favorable outcome from [facility].  Please keep in mind this is not a guarantee of payment. Eligibility and Benefit determinations will be made at the time of service.  Please note any additional information provided by [facility] at the bottom of the screen.

## 2020-05-20 ENCOUNTER — Other Ambulatory Visit: Payer: Self-pay | Admitting: Family Medicine

## 2020-05-20 DIAGNOSIS — F411 Generalized anxiety disorder: Secondary | ICD-10-CM

## 2020-05-20 DIAGNOSIS — F419 Anxiety disorder, unspecified: Secondary | ICD-10-CM

## 2020-05-20 DIAGNOSIS — F32A Depression, unspecified: Secondary | ICD-10-CM

## 2020-05-22 NOTE — Telephone Encounter (Signed)
Please call pt and inform him that he is OVERDUE for a follow up for effexor. A 30 day supply was sent

## 2020-05-22 NOTE — Telephone Encounter (Signed)
Follow Up appointment scheduled for 05/26/2020. AM

## 2020-05-26 ENCOUNTER — Ambulatory Visit (INDEPENDENT_AMBULATORY_CARE_PROVIDER_SITE_OTHER): Payer: BC Managed Care – PPO | Admitting: Family Medicine

## 2020-05-26 ENCOUNTER — Other Ambulatory Visit: Payer: Self-pay

## 2020-05-26 ENCOUNTER — Encounter: Payer: Self-pay | Admitting: Family Medicine

## 2020-05-26 VITALS — BP 140/81 | HR 65 | Ht 76.0 in | Wt 257.2 lb

## 2020-05-26 DIAGNOSIS — N1831 Chronic kidney disease, stage 3a: Secondary | ICD-10-CM

## 2020-05-26 DIAGNOSIS — F32A Depression, unspecified: Secondary | ICD-10-CM

## 2020-05-26 DIAGNOSIS — Z125 Encounter for screening for malignant neoplasm of prostate: Secondary | ICD-10-CM

## 2020-05-26 DIAGNOSIS — S4991XS Unspecified injury of right shoulder and upper arm, sequela: Secondary | ICD-10-CM

## 2020-05-26 DIAGNOSIS — F411 Generalized anxiety disorder: Secondary | ICD-10-CM | POA: Diagnosis not present

## 2020-05-26 DIAGNOSIS — S4991XA Unspecified injury of right shoulder and upper arm, initial encounter: Secondary | ICD-10-CM | POA: Insufficient documentation

## 2020-05-26 DIAGNOSIS — N401 Enlarged prostate with lower urinary tract symptoms: Secondary | ICD-10-CM

## 2020-05-26 DIAGNOSIS — F331 Major depressive disorder, recurrent, moderate: Secondary | ICD-10-CM | POA: Diagnosis not present

## 2020-05-26 DIAGNOSIS — F419 Anxiety disorder, unspecified: Secondary | ICD-10-CM

## 2020-05-26 DIAGNOSIS — Z13220 Encounter for screening for lipoid disorders: Secondary | ICD-10-CM

## 2020-05-26 DIAGNOSIS — J45991 Cough variant asthma: Secondary | ICD-10-CM | POA: Diagnosis not present

## 2020-05-26 MED ORDER — BUPROPION HCL ER (XL) 150 MG PO TB24: 150.0000 mg | ORAL_TABLET | ORAL | 1 refills | Status: DC

## 2020-05-26 MED ORDER — VENLAFAXINE HCL ER 75 MG PO CP24: 75.0000 mg | ORAL_CAPSULE | Freq: Every day | ORAL | 3 refills | Status: DC

## 2020-05-26 NOTE — Progress Notes (Signed)
Established Patient Office Visit  Subjective:  Patient ID: Terry Vaughan, male    DOB: May 30, 1960  Age: 60 y.o. MRN: [id number]  CC:  Chief Complaint  Patient presents with  . Anxiety  . Depression    HPI Terry Vaughan presents for   Follow-up depression/anxiety - he is doing OK overall.  It has been particularly stressful since he had been on the job injury in May that ended up requiring surgical repair on his right shoulder in October.  It has been a rough course ever since then he actually had a pretty extensive injury to that right shoulder.  In fact he still getting some numbness and tingling in that right hand and they are talking about doing a nerve study.  Right now he is going to [facility].  He has been trying to do some exercises on his own at home he is currently being prescribed gabapentin and Celebrex.  He evidently had a torn meniscus, torn rotator cuff and fractured head of the humerus.  He would like to consider making some adjustment to his medication.  He has tried higher dose in the past and did not do well with that.  He is currently taking Effexor 75 mg daily.  He is open to either adding something to it or maybe even just switching medications completely.  Follow-up CKD 3-problems.  Due to recheck renal function.  Follow up asthma-he has noticed a little bit more shortness of breath recently.  Though he has not been able to go to the gym like he used to and he really feels like that might be part of the cause.  Folllow- up BPH-currently on Flomax.  Last PSA was 3 years ago. Lab Results  Component Value Date   PSA 1.0 04/01/2017   PSA 0.8 04/02/2016   PSA 1.32 02/09/2015      Past Medical History:  Diagnosis Date  . Kidney stone   . Perforated bowel ([place]) 10/2012    Past Surgical History:  Procedure Laterality Date  . CHOLECYSTECTOMY  09/2013   Dr. Janae Bridgeman, acute cholecystitis  . PARTIAL COLECTOMY  10/2012   Dr. Janae Bridgeman,  perforated bowel  . PLEURAL SCARIFICATION      No family history on file.  Social History   Socioeconomic History  . Marital status: Divorced    Spouse name: Not on file  . Number of children: Not on file  . Years of education: Not on file  . Highest education level: Not on file  Occupational History  . Not on file  Tobacco Use  . Smoking status: Former Research scientist (life sciences)  . Smokeless tobacco: Never Used  Substance and Sexual Activity  . Alcohol use: No  . Drug use: No  . Sexual activity: Not on file  Other Topics Concern  . Not on file  Social History Narrative  . Not on file   Social Determinants of Health   Financial Resource Strain: Not on file  Food Insecurity: Not on file  Transportation Needs: Not on file  Physical Activity: Not on file  Stress: Not on file  Social Connections: Not on file  Intimate Partner Violence: Not on file    Outpatient Medications Prior to Visit  Medication Sig Dispense Refill  . celecoxib (CELEBREX) 200 MG capsule Take 1 capsule by mouth in the morning and at bedtime.    . Cetirizine HCl (ZYRTEC PO) Take by mouth.    . gabapentin (NEURONTIN) 100 MG capsule Take 1  capsule by mouth in the morning, at noon, and at bedtime.    . pantoprazole (PROTONIX) 40 MG tablet Take 1 tablet (40 mg total) by mouth daily. 90 tablet 3  . tamsulosin (FLOMAX) 0.4 MG CAPS capsule Take 1 capsule (0.4 mg total) by mouth daily. 90 capsule 3  . venlafaxine XR (EFFEXOR-XR) 75 MG 24 hr capsule TAKE 1 CAPSULE(75 MG) BY MOUTH DAILY 30 capsule 0   No facility-administered medications prior to visit.    Allergies  Allergen Reactions  . Buprenorphine Hcl     Other reaction(s): Unknown  . Rozerem [Ramelteon] Other (See Comments)    dizziness  . Iodinated Diagnostic Agents     Other reaction(s): Other Other reaction(s): Confusion  . Meperidine Hcl Nausea Only  . Morphine And Related Nausea Only  . Promethazine Hcl     Other reaction(s): Confusion    ROS Review of  Systems    Objective:    Physical Exam Constitutional:      Appearance: He is well-developed and well-nourished.  HENT:     Head: Normocephalic and atraumatic.  Cardiovascular:     Rate and Rhythm: Normal rate and regular rhythm.     Heart sounds: Normal heart sounds.  Pulmonary:     Effort: Pulmonary effort is normal.     Breath sounds: Normal breath sounds.  Skin:    General: Skin is warm and dry.  Neurological:     Mental Status: He is alert and oriented to person, place, and time.  Psychiatric:        Mood and Affect: Mood and affect normal.        Behavior: Behavior normal.     BP 140/81   Pulse 65   Ht 6\' 4"  (1.93 m)   Wt 257 lb 3.2 oz (116.7 kg)   SpO2 99%   BMI 31.31 kg/m  Wt Readings from Last 3 Encounters:  05/26/20 257 lb 3.2 oz (116.7 kg)  10/14/19 251 lb (113.9 kg)  04/06/19 240 lb (108.9 kg)     Health Maintenance Due  Topic Date Due  . COVID-19 Vaccine (1) Never done  . INFLUENZA VACCINE  12/19/2019    There are no preventive care reminders to display for this patient.  Lab Results  Component Value Date   TSH 1.30 08/29/2015   Lab Results  Component Value Date   WBC 5.7 12/02/2017   HGB 13.8 12/02/2017   HCT 40.7 12/02/2017   MCV 91.5 12/02/2017   PLT 254 12/02/2017   Lab Results  Component Value Date   NA 145 04/30/2019   K 3.9 04/30/2019   CO2 26 04/30/2019   GLUCOSE 84 04/30/2019   BUN 17 04/30/2019   CREATININE 1.40 (H) 04/30/2019   BILITOT 0.6 04/30/2019   ALKPHOS 89 06/29/2016   AST 14 04/30/2019   ALT 15 04/30/2019   PROT 6.4 04/30/2019   ALBUMIN 4.3 06/29/2016   CALCIUM 9.1 04/30/2019   Lab Results  Component Value Date   CHOL 220 (H) 04/30/2019   Lab Results  Component Value Date   HDL 40 04/30/2019   Lab Results  Component Value Date   LDLCALC 158 (H) 04/30/2019   Lab Results  Component Value Date   TRIG 102 04/30/2019   Lab Results  Component Value Date   CHOLHDL 5.5 (H) 04/30/2019   Lab Results   Component Value Date   HGBA1C 5.3 08/29/2015      Assessment & Plan:   Problem List Items Addressed  This Visit      Respiratory   ASTHMA, COUGH VARIANT    He does not sound like he has had a true exacerbation though he does report feeling a little bit more short of breath. So we can certainly keep an eye on this.        Genitourinary   CKD (chronic kidney disease) stage 3, GFR 30-59 ml/min (HCC)    To really recheck renal function really needs to be followed every 6 months.      Relevant Orders   COMPLETE METABOLIC PANEL WITH GFR   Lipid panel   BPH (benign prostatic hyperplasia)    To recheck PSA level. Only on Flomax.      Relevant Orders   PSA     Other   Major depressive disorder, recurrent episode (HCC) - Primary    Gust the option of adding something to his current regimen of Effexor 75 mg or switching. We will try adding Wellbutrin for a little bit of a boost. We can try this for 1 to 2 months and see if it is helpful if not then we can always consider switching the Effexor. He was on fluoxetine in the past.      Relevant Medications   buPROPion (WELLBUTRIN XL) 150 MG 24 hr tablet   venlafaxine XR (EFFEXOR-XR) 75 MG 24 hr capsule   Injury of right shoulder   GAD (generalized anxiety disorder)    See note above.      Relevant Medications   buPROPion (WELLBUTRIN XL) 150 MG 24 hr tablet   venlafaxine XR (EFFEXOR-XR) 75 MG 24 hr capsule    Other Visit Diagnoses    Screening for prostate cancer       Relevant Orders   PSA   Screening, lipid       Relevant Orders   Lipid panel   Anxiety and depression       Relevant Medications   buPROPion (WELLBUTRIN XL) 150 MG 24 hr tablet   venlafaxine XR (EFFEXOR-XR) 75 MG 24 hr capsule     Right shoulder injury-unfortunately he has significant limitation in range of motion he is really unable to extend past 90 degrees even after surgery. He is currently enrolled in physical therapy but does not feel like he is  really making a lot of progress. But he continues to go. His pain is actually better than it was previously. He is being currently followed through [facility]. He still having numbness in the right hand and forearm and will likely be having a nerve conduction study in the future.  Meds ordered this encounter  Medications  . buPROPion (WELLBUTRIN XL) 150 MG 24 hr tablet    Sig: Take 1 tablet (150 mg total) by mouth every morning.    Dispense:  90 tablet    Refill:  1  . venlafaxine XR (EFFEXOR-XR) 75 MG 24 hr capsule    Sig: Take 1 capsule (75 mg total) by mouth daily with breakfast.    Dispense:  90 capsule    Refill:  3    Follow-up: Return in about 6 months (around 11/23/2020) for recheck kidneys .    Nani Gasser, MD

## 2020-05-26 NOTE — Assessment & Plan Note (Signed)
See note above

## 2020-05-26 NOTE — Assessment & Plan Note (Signed)
To really recheck renal function really needs to be followed every 6 months.

## 2020-05-26 NOTE — Assessment & Plan Note (Signed)
Gust the option of adding something to his current regimen of Effexor 75 mg or switching. We will try adding Wellbutrin for a little bit of a boost. We can try this for 1 to 2 months and see if it is helpful if not then we can always consider switching the Effexor. He was on fluoxetine in the past.

## 2020-05-26 NOTE — Assessment & Plan Note (Signed)
He does not sound like he has had a true exacerbation though he does report feeling a little bit more short of breath. So we can certainly keep an eye on this.

## 2020-05-26 NOTE — Assessment & Plan Note (Signed)
To recheck PSA level. Only on Flomax.

## 2020-06-14 ENCOUNTER — Other Ambulatory Visit: Payer: Self-pay | Admitting: Family Medicine

## 2020-06-14 LAB — COMPLETE METABOLIC PANEL WITH GFR
AG Ratio: 1.6 (calc) (ref 1.0–2.5)
ALT: 20 U/L (ref 9–46)
AST: 19 U/L (ref 10–35)
Albumin: 4.1 g/dL (ref 3.6–5.1)
Alkaline phosphatase (APISO): 82 U/L (ref 35–144)
BUN/Creatinine Ratio: 13 (calc) (ref 6–22)
BUN: 17 mg/dL (ref 7–25)
CO2: 27 mmol/L (ref 20–32)
Calcium: 9.3 mg/dL (ref 8.6–10.3)
Chloride: 106 mmol/L (ref 98–110)
Creat: 1.34 mg/dL — ABNORMAL HIGH (ref 0.70–1.25)
GFR, Est African American: 66 mL/min/{1.73_m2} (ref >=60)
GFR, Est Non African American: 57 mL/min/{1.73_m2} — ABNORMAL LOW (ref >=60)
Globulin: 2.5 g/dL (calc) (ref 1.9–3.7)
Glucose, Bld: 99 mg/dL (ref 65–99)
Potassium: 4.3 mmol/L (ref 3.5–5.3)
Sodium: 142 mmol/L (ref 135–146)
Total Bilirubin: 1.1 mg/dL (ref 0.2–1.2)
Total Protein: 6.6 g/dL (ref 6.1–8.1)

## 2020-06-14 LAB — LIPID PANEL
Cholesterol: 179 mg/dL (ref <200)
HDL: 41 mg/dL (ref >=40)
LDL Cholesterol (Calc): 116 mg/dL (calc) — ABNORMAL HIGH
Non-HDL Cholesterol (Calc): 138 mg/dL (calc) — ABNORMAL HIGH (ref <130)
Total CHOL/HDL Ratio: 4.4 (calc) (ref <5.0)
Triglycerides: 113 mg/dL (ref <150)

## 2020-06-14 LAB — PSA: PSA: 1.6 ng/mL (ref <=4.0)

## 2020-06-23 ENCOUNTER — Other Ambulatory Visit: Payer: Self-pay | Admitting: Family Medicine

## 2020-06-23 DIAGNOSIS — F419 Anxiety disorder, unspecified: Secondary | ICD-10-CM

## 2020-06-23 DIAGNOSIS — F411 Generalized anxiety disorder: Secondary | ICD-10-CM

## 2020-06-23 DIAGNOSIS — F32A Depression, unspecified: Secondary | ICD-10-CM

## 2020-07-25 ENCOUNTER — Other Ambulatory Visit: Payer: Self-pay | Admitting: Orthopedic Surgery

## 2020-07-25 DIAGNOSIS — S46011D Strain of muscle(s) and tendon(s) of the rotator cuff of right shoulder, subsequent encounter: Secondary | ICD-10-CM

## 2020-08-07 ENCOUNTER — Ambulatory Visit
Admission: RE | Admit: 2020-08-07 | Discharge: 2020-08-07 | Disposition: A | Discharge status: Home / Self Care | Payer: Worker's Compensation | Source: Ambulatory Visit | Attending: Orthopedic Surgery | Admitting: Orthopedic Surgery

## 2020-08-07 ENCOUNTER — Other Ambulatory Visit: Payer: Self-pay

## 2020-08-07 DIAGNOSIS — S46011D Strain of muscle(s) and tendon(s) of the rotator cuff of right shoulder, subsequent encounter: Secondary | ICD-10-CM

## 2020-08-07 MED ORDER — IOPAMIDOL (ISOVUE-M 200) INJECTION 41%
13.0000 mL | Freq: Once | INTRAMUSCULAR | Status: AC
  Administered 2020-08-07: 13 mL via INTRA_ARTICULAR

## 2020-10-22 ENCOUNTER — Other Ambulatory Visit: Payer: Self-pay | Admitting: Family Medicine

## 2020-11-02 ENCOUNTER — Other Ambulatory Visit: Payer: Self-pay

## 2020-11-02 DIAGNOSIS — K2100 Gastro-esophageal reflux disease with esophagitis, without bleeding: Secondary | ICD-10-CM

## 2020-11-02 MED ORDER — PANTOPRAZOLE SODIUM 40 MG PO TBEC: 40.0000 mg | DELAYED_RELEASE_TABLET | Freq: Every day | ORAL | 0 refills | Status: DC

## 2020-11-23 ENCOUNTER — Encounter: Payer: Self-pay | Admitting: Family Medicine

## 2020-11-23 ENCOUNTER — Ambulatory Visit: Payer: BC Managed Care – PPO | Admitting: Family Medicine

## 2020-11-23 ENCOUNTER — Other Ambulatory Visit: Payer: Self-pay

## 2020-11-23 VITALS — BP 118/65 | HR 76 | Ht 76.0 in | Wt 247.0 lb

## 2020-11-23 DIAGNOSIS — N1831 Chronic kidney disease, stage 3a: Secondary | ICD-10-CM

## 2020-11-23 DIAGNOSIS — F32A Depression, unspecified: Secondary | ICD-10-CM

## 2020-11-23 DIAGNOSIS — F419 Anxiety disorder, unspecified: Secondary | ICD-10-CM

## 2020-11-23 DIAGNOSIS — F411 Generalized anxiety disorder: Secondary | ICD-10-CM

## 2020-11-23 DIAGNOSIS — K2100 Gastro-esophageal reflux disease with esophagitis, without bleeding: Secondary | ICD-10-CM | POA: Diagnosis not present

## 2020-11-23 LAB — BASIC METABOLIC PANEL WITH GFR
BUN/Creatinine Ratio: 10 (calc) (ref 6–22)
BUN: 17 mg/dL (ref 7–25)
CO2: 26 mmol/L (ref 20–32)
Calcium: 9.2 mg/dL (ref 8.6–10.3)
Chloride: 107 mmol/L (ref 98–110)
Creat: 1.72 mg/dL — ABNORMAL HIGH (ref 0.70–1.25)
GFR, Est African American: 49 mL/min/{1.73_m2} — ABNORMAL LOW (ref >=60)
GFR, Est Non African American: 42 mL/min/{1.73_m2} — ABNORMAL LOW (ref >=60)
Glucose, Bld: 113 mg/dL — ABNORMAL HIGH (ref 65–99)
Potassium: 4.1 mmol/L (ref 3.5–5.3)
Sodium: 142 mmol/L (ref 135–146)

## 2020-11-23 MED ORDER — FLUOXETINE HCL 20 MG PO TABS: ORAL_TABLET | ORAL | 1 refills | Status: DC

## 2020-11-23 MED ORDER — PANTOPRAZOLE SODIUM 40 MG PO TBEC: 40.0000 mg | DELAYED_RELEASE_TABLET | Freq: Every day | ORAL | 3 refills | Status: DC

## 2020-11-23 MED ORDER — TAMSULOSIN HCL 0.4 MG PO CAPS: ORAL_CAPSULE | ORAL | 3 refills | Status: DC

## 2020-11-23 MED ORDER — VENLAFAXINE HCL ER 37.5 MG PO CP24: 37.5000 mg | ORAL_CAPSULE | Freq: Every day | ORAL | 0 refills | Status: DC

## 2020-11-23 NOTE — Assessment & Plan Note (Signed)
We will go ahead and refill PPI for 1 year.

## 2020-11-23 NOTE — Assessment & Plan Note (Signed)
Well controlled. Continue current regimen. Follow up in  6 mo due to recheck labs.

## 2020-11-23 NOTE — Progress Notes (Signed)
Established Patient Office Visit  Subjective:  Patient ID: Terry Vaughan, male    DOB: Jul 01, 1960  Age: 60 y.o. MRN: [id number]  CC:  Chief Complaint  Patient presents with   mood     HPI Terry Vaughan presents for follow-up.  He has had a second right shoulder surgery since I last saw him.  He seems to be doing better overall though he is not allowed to do any heavy lifting and is still currently out of work he says its actually been really frustrating.  He feels like he has been feeling more down since not being able to work.  He has had to wait almost 5 weeks to engage in physical therapy because of difficulty of getting coverage through his workers comp.  F/U CKD 3  - no recent changes  F/U GAD - currently on Effexor he just feels like it is no longer working.  Prior to Effexor he was actually on Prozac for probably about 4 5 years maybe even a little longer.  He will start several task but is having difficulty completing them which she says is not like him at all.  Sleep quality has been poor.  He also wanted to switch pharmacies he was having a lot of difficulty getting his medications filled consistently at [facility] and so would like to switch to [facility].  Past Medical History:  Diagnosis Date   Kidney stone    Perforated bowel (HCC) 10/2012    Past Surgical History:  Procedure Laterality Date   CHOLECYSTECTOMY  09/2013   Dr. Yetta Numbers, acute cholecystitis   PARTIAL COLECTOMY  10/2012   Dr. Yetta Numbers, perforated bowel   PLEURAL SCARIFICATION      No family history on file.  Social History   Socioeconomic History   Marital status: Divorced    Spouse name: Not on file   Number of children: Not on file   Years of education: Not on file   Highest education level: Not on file  Occupational History   Not on file  Tobacco Use   Smoking status: Former    Pack years: 0.00   Smokeless tobacco: Never  Substance and Sexual Activity   Alcohol use: No   Drug  use: No   Sexual activity: Not on file  Other Topics Concern   Not on file  Social History Narrative   Not on file   Social Determinants of Health   Financial Resource Strain: Not on file  Food Insecurity: Not on file  Transportation Needs: Not on file  Physical Activity: Not on file  Stress: Not on file  Social Connections: Not on file  Intimate Partner Violence: Not on file    Outpatient Medications Prior to Visit  Medication Sig Dispense Refill   Cetirizine HCl (ZYRTEC PO) Take by mouth.     gabapentin (NEURONTIN) 100 MG capsule Take 1 capsule by mouth in the morning, at noon, and at bedtime.     pantoprazole (PROTONIX) 40 MG tablet Take 1 tablet (40 mg total) by mouth daily. 90 tablet 0   tamsulosin (FLOMAX) 0.4 MG CAPS capsule TAKE 1 CAPSULE(0.4 MG) BY MOUTH DAILY 90 capsule 3   venlafaxine XR (EFFEXOR-XR) 75 MG 24 hr capsule Take 1 capsule (75 mg total) by mouth daily with breakfast. 90 capsule 3   No facility-administered medications prior to visit.    Allergies  Allergen Reactions   Buprenorphine Hcl     Other reaction(s): Unknown   Rozerem [Ramelteon]  Other (See Comments)    dizziness   Iodinated Diagnostic Agents     Other reaction(s): Other Other reaction(s): Confusion   Meperidine Hcl Nausea Only   Morphine And Related Nausea Only   Promethazine Hcl     Other reaction(s): Confusion    ROS Review of Systems    Objective:    Physical Exam Constitutional:      Appearance: He is well-developed.  HENT:     Head: Normocephalic and atraumatic.  Cardiovascular:     Rate and Rhythm: Normal rate and regular rhythm.     Heart sounds: Normal heart sounds.  Pulmonary:     Effort: Pulmonary effort is normal.     Breath sounds: Normal breath sounds.  Skin:    General: Skin is warm and dry.  Neurological:     Mental Status: He is alert and oriented to person, place, and time.  Psychiatric:        Behavior: Behavior normal.    BP 118/65   Pulse 76    Ht 6\' 4"  (1.93 m)   Wt 247 lb (112 kg)   SpO2 96%   BMI 30.07 kg/m  Wt Readings from Last 3 Encounters:  11/23/20 247 lb (112 kg)  05/26/20 257 lb 3.2 oz (116.7 kg)  10/14/19 251 lb (113.9 kg)     Health Maintenance Due  Topic Date Due   COVID-19 Vaccine (1) Never done   Pneumococcal Vaccine 65-61 Years old (1 - PCV) Never done   Zoster Vaccines- Shingrix (1 of 2) Never done    There are no preventive care reminders to display for this patient.  Lab Results  Component Value Date   TSH 1.30 08/29/2015   Lab Results  Component Value Date   WBC 5.7 12/02/2017   HGB 13.8 12/02/2017   HCT 40.7 12/02/2017   MCV 91.5 12/02/2017   PLT 254 12/02/2017   Lab Results  Component Value Date   NA 142 06/13/2020   K 4.3 06/13/2020   CO2 27 06/13/2020   GLUCOSE 99 06/13/2020   BUN 17 06/13/2020   CREATININE 1.34 (H) 06/13/2020   BILITOT 1.1 06/13/2020   ALKPHOS 89 06/29/2016   AST 19 06/13/2020   ALT 20 06/13/2020   PROT 6.6 06/13/2020   ALBUMIN 4.3 06/29/2016   CALCIUM 9.3 06/13/2020   Lab Results  Component Value Date   CHOL 179 06/13/2020   Lab Results  Component Value Date   HDL 41 06/13/2020   Lab Results  Component Value Date   LDLCALC 116 (H) 06/13/2020   Lab Results  Component Value Date   TRIG 113 06/13/2020   Lab Results  Component Value Date   CHOLHDL 4.4 06/13/2020   Lab Results  Component Value Date   HGBA1C 5.3 08/29/2015      Assessment & Plan:   Problem List Items Addressed This Visit       Digestive   Gastroesophageal reflux disease with esophagitis    We will go ahead and refill PPI for 1 year.       Relevant Medications   pantoprazole (PROTONIX) 40 MG tablet     Genitourinary   CKD (chronic kidney disease) stage 3, GFR 30-59 ml/min (HCC) - Primary    Well controlled. Continue current regimen. Follow up in  6 mo due to recheck labs.        Relevant Orders   BASIC METABOLIC PANEL WITH GFR     Other   GAD (generalized  anxiety  disorder)    We discussed options.  He is okay with switching back to fluoxetine.  There are some patients who do become resistant to their medications over time.  I think would be very reasonable to switch back to fluoxetine he was on 60 mg at 1 time we will gently work him back up to that dose over a month.  Plan to follow-up in 6 months if any concerns or any problems he can always come in sooner if needed.       Relevant Medications   venlafaxine XR (EFFEXOR-XR) 37.5 MG 24 hr capsule   FLUoxetine (PROZAC) 20 MG tablet   Other Visit Diagnoses     Anxiety and depression       Relevant Medications   venlafaxine XR (EFFEXOR-XR) 37.5 MG 24 hr capsule   FLUoxetine (PROZAC) 20 MG tablet       Meds ordered this encounter  Medications   pantoprazole (PROTONIX) 40 MG tablet    Sig: Take 1 tablet (40 mg total) by mouth daily.    Dispense:  90 tablet    Refill:  3   tamsulosin (FLOMAX) 0.4 MG CAPS capsule    Sig: TAKE 1 CAPSULE(0.4 MG) BY MOUTH DAILY    Dispense:  90 capsule    Refill:  3   venlafaxine XR (EFFEXOR-XR) 37.5 MG 24 hr capsule    Sig: Take 1 capsule (37.5 mg total) by mouth daily with breakfast. X 10 days and then stop    Dispense:  10 capsule    Refill:  0   FLUoxetine (PROZAC) 20 MG tablet    Sig: Take 1 tablet (20 mg total) by mouth daily for 10 days, THEN 2 tablets (40 mg total) daily for 10 days, THEN 3 tablets (60 mg total) daily for 10 days. Ok to start after you complete venlafaxine.    Dispense:  90 tablet    Refill:  1     Follow-up: Return in about 6 months (around 05/26/2021) for change in medication, and kidney check up.    Nani Gasser, MD

## 2020-11-23 NOTE — Assessment & Plan Note (Signed)
We discussed options.  He is okay with switching back to fluoxetine.  There are some patients who do become resistant to their medications over time.  I think would be very reasonable to switch back to fluoxetine he was on 60 mg at 1 time we will gently work him back up to that dose over a month.  Plan to follow-up in 6 months if any concerns or any problems he can always come in sooner if needed.

## 2020-11-24 ENCOUNTER — Other Ambulatory Visit: Payer: Self-pay

## 2020-11-24 DIAGNOSIS — N1831 Chronic kidney disease, stage 3a: Secondary | ICD-10-CM

## 2020-11-24 NOTE — Addendum Note (Signed)
Addended by: Nani Gasser D on: 11/24/2020 11:41 AM   Modules accepted: Orders

## 2020-12-05 ENCOUNTER — Ambulatory Visit (INDEPENDENT_AMBULATORY_CARE_PROVIDER_SITE_OTHER): Payer: BC Managed Care – PPO

## 2020-12-05 ENCOUNTER — Other Ambulatory Visit: Payer: Self-pay

## 2020-12-05 DIAGNOSIS — N1831 Chronic kidney disease, stage 3a: Secondary | ICD-10-CM | POA: Diagnosis not present

## 2020-12-06 ENCOUNTER — Encounter: Payer: Self-pay | Admitting: Family Medicine

## 2020-12-06 DIAGNOSIS — K760 Fatty (change of) liver, not elsewhere classified: Secondary | ICD-10-CM | POA: Insufficient documentation

## 2021-02-12 ENCOUNTER — Other Ambulatory Visit: Payer: Self-pay

## 2021-02-12 ENCOUNTER — Ambulatory Visit: Payer: BC Managed Care – PPO | Admitting: Family Medicine

## 2021-02-12 DIAGNOSIS — F331 Major depressive disorder, recurrent, moderate: Secondary | ICD-10-CM

## 2021-02-12 NOTE — Progress Notes (Signed)
Established Patient Office Visit  Subjective:  Patient ID: Terry Vaughan, male    DOB: Aug 17, 1960  Age: 60 y.o. MRN: [id number]  CC:  Chief Complaint  Patient presents with   Anxiety   Depression    HPI KEM HENSEN presents for   Anxiety/depression-he started the fluoxetine but just dated 20 mg daily he said initially he actually started to feel worse and so he started just taking a half a tab so is really only been taking 10 mg.  He did not taper upward.  Did not realize he thought the bottle said 1 a day.  He is not had any thoughts of wanting to harm himself.  He still unfortunately out of work because of his shoulder he has not been released back to working on the [employer].  He says when the shoulders at rest he is does not have a lot of pain or discomfort but is more with motion.  He is trying to hold off on having to get a shoulder replacement.    Past Medical History:  Diagnosis Date   Kidney stone    Perforated bowel (HCC) 10/2012    Past Surgical History:  Procedure Laterality Date   CHOLECYSTECTOMY  09/2013   Dr. Yetta Numbers, acute cholecystitis   PARTIAL COLECTOMY  10/2012   Dr. Yetta Numbers, perforated bowel   PLEURAL SCARIFICATION      No family history on file.  Social History   Socioeconomic History   Marital status: Divorced    Spouse name: Not on file   Number of children: Not on file   Years of education: Not on file   Highest education level: Not on file  Occupational History   Not on file  Tobacco Use   Smoking status: Former   Smokeless tobacco: Never  Substance and Sexual Activity   Alcohol use: No   Drug use: No   Sexual activity: Not on file  Other Topics Concern   Not on file  Social History Narrative   Not on file   Social Determinants of Health   Financial Resource Strain: Not on file  Food Insecurity: Not on file  Transportation Needs: Not on file  Physical Activity: Not on file  Stress: Not on file  Social Connections: Not on  file  Intimate Partner Violence: Not on file    Outpatient Medications Prior to Visit  Medication Sig Dispense Refill   Cetirizine HCl (ZYRTEC PO) Take by mouth.     pantoprazole (PROTONIX) 40 MG tablet Take 1 tablet (40 mg total) by mouth daily. 90 tablet 3   tamsulosin (FLOMAX) 0.4 MG CAPS capsule TAKE 1 CAPSULE(0.4 MG) BY MOUTH DAILY 90 capsule 3   FLUoxetine (PROZAC) 20 MG tablet Take 1 tablet (20 mg total) by mouth daily for 10 days, THEN 2 tablets (40 mg total) daily for 10 days, THEN 3 tablets (60 mg total) daily for 10 days. Ok to start after you complete venlafaxine. 90 tablet 1   gabapentin (NEURONTIN) 100 MG capsule Take 1 capsule by mouth in the morning, at noon, and at bedtime.     venlafaxine XR (EFFEXOR-XR) 37.5 MG 24 hr capsule Take 1 capsule (37.5 mg total) by mouth daily with breakfast. X 10 days and then stop 10 capsule 0   No facility-administered medications prior to visit.    Allergies  Allergen Reactions   Buprenorphine Hcl     Other reaction(s): Unknown   Rozerem [Ramelteon] Other (See Comments)    dizziness  Iodinated Diagnostic Agents     Other reaction(s): Other Other reaction(s): Confusion   Meperidine Hcl Nausea Only   Morphine And Related Nausea Only   Promethazine Hcl     Other reaction(s): Confusion    ROS Review of Systems    Objective:    Physical Exam Constitutional:      Appearance: Normal appearance. He is well-developed.  HENT:     Head: Normocephalic and atraumatic.  Cardiovascular:     Rate and Rhythm: Normal rate and regular rhythm.     Heart sounds: Normal heart sounds.  Pulmonary:     Effort: Pulmonary effort is normal.     Breath sounds: Normal breath sounds.  Skin:    General: Skin is warm and dry.  Neurological:     Mental Status: He is alert and oriented to person, place, and time. Mental status is at baseline.  Psychiatric:        Behavior: Behavior normal.    BP 117/63   Pulse 74   Ht 6\' 4"  (1.93 m)   Wt 245  lb (111.1 kg)   SpO2 97%   BMI 29.82 kg/m  Wt Readings from Last 3 Encounters:  02/12/21 245 lb (111.1 kg)  11/23/20 247 lb (112 kg)  05/26/20 257 lb 3.2 oz (116.7 kg)     There are no preventive care reminders to display for this patient.  There are no preventive care reminders to display for this patient.  Lab Results  Component Value Date   TSH 1.30 08/29/2015   Lab Results  Component Value Date   WBC 5.7 12/02/2017   HGB 13.8 12/02/2017   HCT 40.7 12/02/2017   MCV 91.5 12/02/2017   PLT 254 12/02/2017   Lab Results  Component Value Date   NA 142 11/23/2020   K 4.1 11/23/2020   CO2 26 11/23/2020   GLUCOSE 113 (H) 11/23/2020   BUN 17 11/23/2020   CREATININE 1.72 (H) 11/23/2020   BILITOT 1.1 06/13/2020   ALKPHOS 89 06/29/2016   AST 19 06/13/2020   ALT 20 06/13/2020   PROT 6.6 06/13/2020   ALBUMIN 4.3 06/29/2016   CALCIUM 9.2 11/23/2020   Lab Results  Component Value Date   CHOL 179 06/13/2020   Lab Results  Component Value Date   HDL 41 06/13/2020   Lab Results  Component Value Date   LDLCALC 116 (H) 06/13/2020   Lab Results  Component Value Date   TRIG 113 06/13/2020   Lab Results  Component Value Date   CHOLHDL 4.4 06/13/2020   Lab Results  Component Value Date   HGBA1C 5.3 08/29/2015      Assessment & Plan:   Problem List Items Addressed This Visit       Other   Major depressive disorder, recurrent episode (HCC)    We discussed a couple of options we can try tapering the fluoxetine up to 60 mg.  So he can go to a whole tab for couple of days and then 2 tabs for about 10 to 14 days and then 3 tabs for a total of 60 mg.  We will give it 6 weeks to see if it feels like it starting to be effective and helpful and if not then we can try something completely different.  He was on the fluoxetine for years and did well on it until it just seemed to be ineffective.  He does rate his symptoms as very difficult.  PHQ-9 score of 18 and GAD-7 score  of  20 today.       No orders of the defined types were placed in this encounter.   Follow-up: Return in about 6 weeks (around 03/26/2021) for New start medication.   I spent 25 minutes on the day of the encounter to include pre-visit record review, face-to-face time with the patient and post visit ordering of test.   Nani Gasser, MD

## 2021-02-12 NOTE — Assessment & Plan Note (Signed)
We discussed a couple of options we can try tapering the fluoxetine up to 60 mg.  So he can go to a whole tab for couple of days and then 2 tabs for about 10 to 14 days and then 3 tabs for a total of 60 mg.  We will give it 6 weeks to see if it feels like it starting to be effective and helpful and if not then we can try something completely different.  He was on the fluoxetine for years and did well on it until it just seemed to be ineffective.  He does rate his symptoms as very difficult.  PHQ-9 score of 18 and GAD-7 score of 20 today.

## 2021-02-12 NOTE — Progress Notes (Signed)
Pt is on 60 mg of the Fluoxetine. He reports that his sxs are worse he feels more tired,anxious and depressed.   This prescription expired on 12/23/2020

## 2021-03-26 ENCOUNTER — Other Ambulatory Visit: Payer: Self-pay

## 2021-03-26 ENCOUNTER — Encounter: Payer: Self-pay | Admitting: Family Medicine

## 2021-03-26 ENCOUNTER — Ambulatory Visit: Payer: BC Managed Care – PPO | Admitting: Family Medicine

## 2021-03-26 VITALS — BP 134/64 | HR 79 | Ht 76.0 in | Wt 239.0 lb

## 2021-03-26 DIAGNOSIS — F331 Major depressive disorder, recurrent, moderate: Secondary | ICD-10-CM

## 2021-03-26 DIAGNOSIS — Z23 Encounter for immunization: Secondary | ICD-10-CM

## 2021-03-26 DIAGNOSIS — J45991 Cough variant asthma: Secondary | ICD-10-CM

## 2021-03-26 MED ORDER — ESCITALOPRAM OXALATE 10 MG PO TABS: ORAL_TABLET | ORAL | 2 refills | Status: DC

## 2021-03-26 NOTE — Assessment & Plan Note (Signed)
Recommend pneumonia vaccine today.

## 2021-03-26 NOTE — Patient Instructions (Signed)
Decrease your Prozac down to 2 capsules daily for 1 week, then 1 capsule daily for 1 week, and then the very next day please stop the Prozac and okay to start half of a tab of the escitalopram ( Lexapro).

## 2021-03-26 NOTE — Progress Notes (Signed)
Established Patient Office Visit  Subjective:  Patient ID: Terry Vaughan, male    DOB: Jul 10, 1960  Age: 60 y.o. MRN: [id number]  CC:  Chief Complaint  Patient presents with   mdd    Previous PHQ= 21/VD GAD=14/VD    HPI Terry Vaughan presents for MDD.  Taking fluoxetine.  He is now up to 60 mg and just feels like it really has not made a difference in fact a couple of weeks ago he almost called because he actually started to feel the office that he felt almost more down.  He was hoping to get released back to work but unfortunately the orthopedist did not want him lifting more than 5 pounds with that shoulder that was injured.  Because of the risk for repeat dislocation.  This has been really a struggle for him as he really wants to be back at work.  Past Medical History:  Diagnosis Date   Kidney stone    Perforated bowel (HCC) 10/2012    Past Surgical History:  Procedure Laterality Date   CHOLECYSTECTOMY  09/2013   Dr. Yetta Numbers, acute cholecystitis   PARTIAL COLECTOMY  10/2012   Dr. Yetta Numbers, perforated bowel   PLEURAL SCARIFICATION      No family history on file.  Social History   Socioeconomic History   Marital status: Divorced    Spouse name: Not on file   Number of children: Not on file   Years of education: Not on file   Highest education level: Not on file  Occupational History   Not on file  Tobacco Use   Smoking status: Former   Smokeless tobacco: Never  Substance and Sexual Activity   Alcohol use: No   Drug use: No   Sexual activity: Not on file  Other Topics Concern   Not on file  Social History Narrative   Not on file   Social Determinants of Health   Financial Resource Strain: Not on file  Food Insecurity: Not on file  Transportation Needs: Not on file  Physical Activity: Not on file  Stress: Not on file  Social Connections: Not on file  Intimate Partner Violence: Not on file    Outpatient Medications Prior to Visit  Medication Sig  Dispense Refill   Cetirizine HCl (ZYRTEC PO) Take by mouth.     pantoprazole (PROTONIX) 40 MG tablet Take 1 tablet (40 mg total) by mouth daily. 90 tablet 3   tamsulosin (FLOMAX) 0.4 MG CAPS capsule TAKE 1 CAPSULE(0.4 MG) BY MOUTH DAILY 90 capsule 3   FLUoxetine (PROZAC) 20 MG tablet Take 1 tablet (20 mg total) by mouth daily for 10 days, THEN 2 tablets (40 mg total) daily for 10 days, THEN 3 tablets (60 mg total) daily for 10 days. Ok to start after you complete venlafaxine. 90 tablet 1   No facility-administered medications prior to visit.    Allergies  Allergen Reactions   Buprenorphine Hcl     Other reaction(s): Unknown   Rozerem [Ramelteon] Other (See Comments)    dizziness   Iodinated Diagnostic Agents     Other reaction(s): Other Other reaction(s): Confusion   Meperidine Hcl Nausea Only   Morphine And Related Nausea Only   Promethazine Hcl     Other reaction(s): Confusion    ROS Review of Systems    Objective:    Physical Exam  BP 134/64   Pulse 79   Ht 6\' 4"  (1.93 m)   Wt 239 lb (108.4 kg)  SpO2 98%   BMI 29.09 kg/m  Wt Readings from Last 3 Encounters:  03/26/21 239 lb (108.4 kg)  02/12/21 245 lb (111.1 kg)  11/23/20 247 lb (112 kg)     There are no preventive care reminders to display for this patient.   There are no preventive care reminders to display for this patient.  Lab Results  Component Value Date   TSH 1.30 08/29/2015   Lab Results  Component Value Date   WBC 5.7 12/02/2017   HGB 13.8 12/02/2017   HCT 40.7 12/02/2017   MCV 91.5 12/02/2017   PLT 254 12/02/2017   Lab Results  Component Value Date   NA 142 11/23/2020   K 4.1 11/23/2020   CO2 26 11/23/2020   GLUCOSE 113 (H) 11/23/2020   BUN 17 11/23/2020   CREATININE 1.72 (H) 11/23/2020   BILITOT 1.1 06/13/2020   ALKPHOS 89 06/29/2016   AST 19 06/13/2020   ALT 20 06/13/2020   PROT 6.6 06/13/2020   ALBUMIN 4.3 06/29/2016   CALCIUM 9.2 11/23/2020   Lab Results  Component  Value Date   CHOL 179 06/13/2020   Lab Results  Component Value Date   HDL 41 06/13/2020   Lab Results  Component Value Date   LDLCALC 116 (H) 06/13/2020   Lab Results  Component Value Date   TRIG 113 06/13/2020   Lab Results  Component Value Date   CHOLHDL 4.4 06/13/2020   Lab Results  Component Value Date   HGBA1C 5.3 08/29/2015      Assessment & Plan:   Problem List Items Addressed This Visit       Respiratory   ASTHMA, COUGH VARIANT    Recommend pneumonia vaccine today.        Other   Major depressive disorder, recurrent episode (HCC) - Primary    Discussed options. Till taper off the Prozac and start Lexapro. Has a family member that does well on Lexapro. F/U in Jan.  We discussed therapy/counseling but he declined.  Says not interested at this point in time.  I think a lot of this is just not being able to go to work is really frustrating him he wants to be able to work.      Relevant Medications   escitalopram (LEXAPRO) 10 MG tablet    Given Prevnar 20 vaccine today.  Will need repeat booster in 5 years and then he will complete the vaccine series.  Meds ordered this encounter  Medications   escitalopram (LEXAPRO) 10 MG tablet    Sig: Take 0.5 tablets (5 mg total) by mouth daily for 6 days, THEN 1 tablet (10 mg total) daily for 24 days.    Dispense:  30 tablet    Refill:  2    Follow-up: Return in about 10 weeks (around 06/04/2021) for New start medication.    Nani Gasser, MD

## 2021-03-26 NOTE — Addendum Note (Signed)
Addended by: Deno Etienne on: 03/26/2021 08:09 AM   Modules accepted: Orders

## 2021-03-26 NOTE — Assessment & Plan Note (Addendum)
Discussed options. Till taper off the Prozac and start Lexapro. Has a family member that does well on Lexapro. F/U in Jan.  We discussed therapy/counseling but he declined.  Says not interested at this point in time.  I think a lot of this is just not being able to go to work is really frustrating him he wants to be able to work.

## 2021-05-28 ENCOUNTER — Other Ambulatory Visit: Payer: Self-pay

## 2021-05-28 ENCOUNTER — Encounter: Payer: Self-pay | Admitting: Family Medicine

## 2021-05-28 ENCOUNTER — Ambulatory Visit: Payer: BC Managed Care – PPO | Admitting: Family Medicine

## 2021-05-28 VITALS — BP 117/59 | HR 73 | Ht 76.0 in | Wt 241.0 lb

## 2021-05-28 DIAGNOSIS — F331 Major depressive disorder, recurrent, moderate: Secondary | ICD-10-CM

## 2021-05-28 DIAGNOSIS — N1831 Chronic kidney disease, stage 3a: Secondary | ICD-10-CM | POA: Diagnosis not present

## 2021-05-28 DIAGNOSIS — N401 Enlarged prostate with lower urinary tract symptoms: Secondary | ICD-10-CM | POA: Diagnosis not present

## 2021-05-28 DIAGNOSIS — E785 Hyperlipidemia, unspecified: Secondary | ICD-10-CM | POA: Diagnosis not present

## 2021-05-28 NOTE — Progress Notes (Signed)
He reports that the medication is working better, he is taking Lexapro 10 mg

## 2021-05-28 NOTE — Assessment & Plan Note (Signed)
Doing well on current regimen he is actually been really happy with this medication choice.  Plan to follow-up in about 2 more months of at that point we can see if he is happy with his dosing regimen or if we may need to make any adjustments.

## 2021-05-28 NOTE — Assessment & Plan Note (Signed)
Continuing to follow PSA yearly.

## 2021-05-28 NOTE — Assessment & Plan Note (Signed)
Due to recheck renal function. 

## 2021-05-28 NOTE — Progress Notes (Signed)
Established Patient Office Visit  Subjective:  Patient ID: Terry Vaughan, male    DOB: 08/09/1960  Age: 61 y.o. MRN: [id number]  CC:  Chief Complaint  Patient presents with   mood    HPI Terry Vaughan presents for f/u mood medication.  Now on Lexapro and feels he is doing well.  Has been on it for about 2 months.  He has been on the 10mg  dose for about 3 weeks and has been doing well so far  Feels like AM is bets time to take the medication.  He denies any side effects.  He says that what he is noticed most is that his sleep quality is better.  And also his gut seems to feel a lot better on the Lexapro.    Past Medical History:  Diagnosis Date   Kidney stone    Perforated bowel (HCC) 10/2012    Past Surgical History:  Procedure Laterality Date   CHOLECYSTECTOMY  09/2013   Dr. Yetta Numbers, acute cholecystitis   PARTIAL COLECTOMY  10/2012   Dr. Yetta Numbers, perforated bowel   PLEURAL SCARIFICATION      No family history on file.  Social History   Socioeconomic History   Marital status: Divorced    Spouse name: Not on file   Number of children: Not on file   Years of education: Not on file   Highest education level: Not on file  Occupational History   Not on file  Tobacco Use   Smoking status: Former   Smokeless tobacco: Never  Substance and Sexual Activity   Alcohol use: No   Drug use: No   Sexual activity: Not on file  Other Topics Concern   Not on file  Social History Narrative   Not on file   Social Determinants of Health   Financial Resource Strain: Not on file  Food Insecurity: Not on file  Transportation Needs: Not on file  Physical Activity: Not on file  Stress: Not on file  Social Connections: Not on file  Intimate Partner Violence: Not on file    Outpatient Medications Prior to Visit  Medication Sig Dispense Refill   Cetirizine HCl (ZYRTEC PO) Take by mouth.     escitalopram (LEXAPRO) 10 MG tablet Take 0.5 tablets (5 mg total) by mouth daily  for 6 days, THEN 1 tablet (10 mg total) daily for 24 days. 30 tablet 2   pantoprazole (PROTONIX) 40 MG tablet Take 1 tablet (40 mg total) by mouth daily. 90 tablet 3   tamsulosin (FLOMAX) 0.4 MG CAPS capsule TAKE 1 CAPSULE(0.4 MG) BY MOUTH DAILY 90 capsule 3   No facility-administered medications prior to visit.    Allergies  Allergen Reactions   Buprenorphine Hcl     Other reaction(s): Unknown   Rozerem [Ramelteon] Other (See Comments)    dizziness   Iodinated Contrast Media     Other reaction(s): Other Other reaction(s): Confusion   Meperidine Hcl Nausea Only   Morphine And Related Nausea Only   Promethazine Hcl     Other reaction(s): Confusion    ROS Review of Systems    Objective:    Physical Exam Constitutional:      Appearance: Normal appearance. He is well-developed.  HENT:     Head: Normocephalic and atraumatic.  Cardiovascular:     Rate and Rhythm: Normal rate and regular rhythm.     Heart sounds: Normal heart sounds.  Pulmonary:     Effort: Pulmonary effort is normal.  Breath sounds: Normal breath sounds.  Skin:    General: Skin is warm and dry.  Neurological:     Mental Status: He is alert and oriented to person, place, and time. Mental status is at baseline.  Psychiatric:        Behavior: Behavior normal.    BP (!) 117/59    Pulse 73    Ht 6\' 4"  (1.93 m)    Wt 241 lb (109.3 kg)    SpO2 99%    BMI 29.34 kg/m  Wt Readings from Last 3 Encounters:  05/28/21 241 lb (109.3 kg)  03/26/21 239 lb (108.4 kg)  02/12/21 245 lb (111.1 kg)     There are no preventive care reminders to display for this patient.  There are no preventive care reminders to display for this patient.  Lab Results  Component Value Date   TSH 1.30 08/29/2015   Lab Results  Component Value Date   WBC 5.7 12/02/2017   HGB 13.8 12/02/2017   HCT 40.7 12/02/2017   MCV 91.5 12/02/2017   PLT 254 12/02/2017   Lab Results  Component Value Date   NA 142 11/23/2020   K 4.1  11/23/2020   CO2 26 11/23/2020   GLUCOSE 113 (H) 11/23/2020   BUN 17 11/23/2020   CREATININE 1.72 (H) 11/23/2020   BILITOT 1.1 06/13/2020   ALKPHOS 89 06/29/2016   AST 19 06/13/2020   ALT 20 06/13/2020   PROT 6.6 06/13/2020   ALBUMIN 4.3 06/29/2016   CALCIUM 9.2 11/23/2020   Lab Results  Component Value Date   CHOL 179 06/13/2020   Lab Results  Component Value Date   HDL 41 06/13/2020   Lab Results  Component Value Date   LDLCALC 116 (H) 06/13/2020   Lab Results  Component Value Date   TRIG 113 06/13/2020   Lab Results  Component Value Date   CHOLHDL 4.4 06/13/2020   Lab Results  Component Value Date   HGBA1C 5.3 08/29/2015      Assessment & Plan:   Problem List Items Addressed This Visit       Genitourinary   CKD (chronic kidney disease) stage 3, GFR 30-59 ml/min (HCC)    Due to recheck renal function.      Relevant Orders   Lipid panel   COMPLETE METABOLIC PANEL WITH GFR   PSA   BPH (benign prostatic hyperplasia) - Primary    Continuing to follow PSA yearly.      Relevant Orders   Lipid panel   COMPLETE METABOLIC PANEL WITH GFR   PSA     Other   Major depressive disorder, recurrent episode ([place])    Doing well on current regimen he is actually been really happy with this medication choice.  Plan to follow-up in about 2 more months of at that point we can see if he is happy with his dosing regimen or if we may need to make any adjustments.      Hyperlipidemia    Due to recheck lipid panel      Relevant Orders   Lipid panel   COMPLETE METABOLIC PANEL WITH GFR   PSA    No orders of the defined types were placed in this encounter.   Follow-up: Return in about 2 months (around 07/26/2021) for F/U new medication .    Beatrice Lecher, MD

## 2021-05-28 NOTE — Assessment & Plan Note (Signed)
Due to recheck lipid panel. 

## 2021-05-30 LAB — COMPLETE METABOLIC PANEL WITH GFR
AG Ratio: 2 (calc) (ref 1.0–2.5)
ALT: 13 U/L (ref 9–46)
AST: 14 U/L (ref 10–35)
Albumin: 4.1 g/dL (ref 3.6–5.1)
Alkaline phosphatase (APISO): 77 U/L (ref 35–144)
BUN: 12 mg/dL (ref 7–25)
CO2: 30 mmol/L (ref 20–32)
Calcium: 9.1 mg/dL (ref 8.6–10.3)
Chloride: 107 mmol/L (ref 98–110)
Creat: 1.13 mg/dL (ref 0.70–1.35)
Globulin: 2.1 g/dL (calc) (ref 1.9–3.7)
Glucose, Bld: 83 mg/dL (ref 65–99)
Potassium: 4.7 mmol/L (ref 3.5–5.3)
Sodium: 143 mmol/L (ref 135–146)
Total Bilirubin: 0.7 mg/dL (ref 0.2–1.2)
Total Protein: 6.2 g/dL (ref 6.1–8.1)
eGFR: 74 mL/min/{1.73_m2} (ref >=60)

## 2021-05-30 LAB — LIPID PANEL
Cholesterol: 194 mg/dL (ref <200)
HDL: 41 mg/dL (ref >=40)
LDL Cholesterol (Calc): 132 mg/dL (calc) — ABNORMAL HIGH
Non-HDL Cholesterol (Calc): 153 mg/dL (calc) — ABNORMAL HIGH (ref <130)
Total CHOL/HDL Ratio: 4.7 (calc) (ref <5.0)
Triglycerides: 104 mg/dL (ref <150)

## 2021-05-30 LAB — PSA: PSA: 1.69 ng/mL (ref <=4.00)

## 2021-05-30 NOTE — Progress Notes (Signed)
Call patient: LDL cholesterol has jumped up slightly it was 116 last year it is up to 132.  Just really encourage him to work on healthy diet and regular exercise, to reduce his overall risk for cardiovascular disease.  Prostate test and metabolic panel look great.  The 10-year ASCVD risk score (Arnett DK, et al., 2019) is: 9%   Values used to calculate the score:     Age: 61 years     Sex: Male     Is Non-Hispanic African American: No     Diabetic: No     Tobacco smoker: No     Systolic Blood Pressure: 117 mmHg     Is BP treated: No     HDL Cholesterol: 41 mg/dL     Total Cholesterol: 194 mg/dL

## 2021-07-13 ENCOUNTER — Other Ambulatory Visit: Payer: Self-pay | Admitting: Family Medicine

## 2021-07-26 ENCOUNTER — Other Ambulatory Visit: Payer: Self-pay

## 2021-07-26 ENCOUNTER — Ambulatory Visit: Payer: BC Managed Care – PPO | Admitting: Family Medicine

## 2021-07-26 ENCOUNTER — Encounter: Payer: Self-pay | Admitting: Family Medicine

## 2021-07-26 VITALS — BP 124/70 | HR 65 | Resp 18 | Ht 76.0 in | Wt 245.0 lb

## 2021-07-26 DIAGNOSIS — F411 Generalized anxiety disorder: Secondary | ICD-10-CM | POA: Diagnosis not present

## 2021-07-26 MED ORDER — DULOXETINE HCL 30 MG PO CPEP: 30.0000 mg | ORAL_CAPSULE | Freq: Every day | ORAL | 1 refills | Status: DC

## 2021-07-26 NOTE — Patient Instructions (Signed)
Cut the Lexapro in half and take half a tab daily for 6 days.  Then the following day you can switch to the duloxetine.  You will just take 1 a day.  It will take a good 2 to 3 weeks to really kick in but we can see if you like this a little better. ?

## 2021-07-26 NOTE — Assessment & Plan Note (Signed)
We discussed options.  I do not think going up on the Lexapro is going to make a big change in his current symptoms.  Interestingly his GAD-7 score is very similar to what it was 8 weeks ago but his depression score has more than doubled.  I really think he would benefit from formal therapy/counseling I think having this traumatic injury that has completely impacted his ability to work is really affecting his mood and even self worth to some degree.  He says he will think about it and he is open to it he wants to do the testing coming up at the end of the month first and maybe have a better understanding of where things are going.  We will switch to Cymbalta.  I think it could help with his mood especially more depression symptoms that he seems to be struggling more with over the last couple of months as well as maybe even improve his pain by 25 to 30% which I think could be helpful.  New prescription sent to pharmacy had like to see him back in about 5 or 6 weeks. ?

## 2021-07-26 NOTE — Progress Notes (Signed)
? ?Established Patient Office Visit ? ?Subjective:  ?Patient ID: Terry Vaughan, male    DOB: 04/27/1961  Age: 61 y.o. MRN: [id number] ? ?CC:  ?Chief Complaint  ?Patient presents with  ? Depression  ?  Anxiety Follow up. Patient states Lexapro is not working as well as he hoped that it would. Patient would like to discuss options.   ? ? ?HPI ?Terry Vaughan presents for  ? ?Anxiety Follow up. Patient states Lexapro is not working as well as he hoped that it would. Patient would like to discuss options.  When I saw him about 2 months ago he had been on it for about a month at that point in time and felt like it was starting to really help and felt really positive about it.  But in the last 2 months he feels like his mood has shifted.  He is feeling more anxious.  For example he has gone to the grocery store and then suddenly felt like he just needed to leave.  He had to leave the car and then go to his truck and lay down in the back.  He is also just felt more down.  He has been staying on a routine and has been sleeping.  He still has persistent pain with that right shoulder and has a strength test coming up towards the end of the month.  They are still trying to figure out with his lawyers about coverage and whether or not he can return to work and long-term if he will be able to work or not.  He is not currently engaged in any therapy or counseling. ? ?Past Medical History:  ?Diagnosis Date  ? Kidney stone   ? Perforated bowel ([place]) 10/2012  ? ? ?Past Surgical History:  ?Procedure Laterality Date  ? CHOLECYSTECTOMY  09/2013  ? Dr. Janae Bridgeman, acute cholecystitis  ? PARTIAL COLECTOMY  10/2012  ? Dr. Janae Bridgeman, perforated bowel  ? PLEURAL SCARIFICATION    ? ? ?No family history on file. ? ?Social History  ? ?Socioeconomic History  ? Marital status: Divorced  ?  Spouse name: Not on file  ? Number of children: Not on file  ? Years of education: Not on file  ? Highest education level: Not on file  ?Occupational History  ?  Not on file  ?Tobacco Use  ? Smoking status: Former  ? Smokeless tobacco: Never  ?Substance and Sexual Activity  ? Alcohol use: No  ? Drug use: No  ? Sexual activity: Not on file  ?Other Topics Concern  ? Not on file  ?Social History Narrative  ? Not on file  ? ?Social Determinants of Health  ? ?Financial Resource Strain: Not on file  ?Food Insecurity: Not on file  ?Transportation Needs: Not on file  ?Physical Activity: Not on file  ?Stress: Not on file  ?Social Connections: Not on file  ?Intimate Partner Violence: Not on file  ? ? ?Outpatient Medications Prior to Visit  ?Medication Sig Dispense Refill  ? Cetirizine HCl (ZYRTEC PO) Take by mouth.    ? pantoprazole (PROTONIX) 40 MG tablet Take 1 tablet (40 mg total) by mouth daily. 90 tablet 3  ? tamsulosin (FLOMAX) 0.4 MG CAPS capsule TAKE 1 CAPSULE(0.4 MG) BY MOUTH DAILY 90 capsule 3  ? escitalopram (LEXAPRO) 10 MG tablet Take 1 tablet (10 mg total) by mouth daily. 30 tablet 2  ? ?No facility-administered medications prior to visit.  ? ? ?Allergies  ?Allergen Reactions  ?  Buprenorphine Hcl   ?  Other reaction(s): Unknown  ? Rozerem [Ramelteon] Other (See Comments)  ?  dizziness  ? Iodinated Contrast Media   ?  Other reaction(s): Other ?Other reaction(s): Confusion  ? Meperidine Hcl Nausea Only  ? Morphine And Related Nausea Only  ? Promethazine Hcl   ?  Other reaction(s): Confusion  ? ? ?ROS ?Review of Systems ? ?  ?Objective:  ?  ?Physical Exam ? ?BP 124/70   Pulse 65   Resp 18   Ht '6\' 4"'  (1.93 m)   Wt 245 lb (111.1 kg)   SpO2 95%   BMI 29.82 kg/m?  ?Wt Readings from Last 3 Encounters:  ?07/26/21 245 lb (111.1 kg)  ?05/28/21 241 lb (109.3 kg)  ?03/26/21 239 lb (108.4 kg)  ? ? ? ?There are no preventive care reminders to display for this patient. ? ?There are no preventive care reminders to display for this patient. ? ?Lab Results  ?Component Value Date  ? TSH 1.30 08/29/2015  ? ?Lab Results  ?Component Value Date  ? WBC 5.7 12/02/2017  ? HGB 13.8 12/02/2017   ? HCT 40.7 12/02/2017  ? MCV 91.5 12/02/2017  ? PLT 254 12/02/2017  ? ?Lab Results  ?Component Value Date  ? NA 143 05/29/2021  ? K 4.7 05/29/2021  ? CO2 30 05/29/2021  ? GLUCOSE 83 05/29/2021  ? BUN 12 05/29/2021  ? CREATININE 1.13 05/29/2021  ? BILITOT 0.7 05/29/2021  ? ALKPHOS 89 06/29/2016  ? AST 14 05/29/2021  ? ALT 13 05/29/2021  ? PROT 6.2 05/29/2021  ? ALBUMIN 4.3 06/29/2016  ? CALCIUM 9.1 05/29/2021  ? EGFR 74 05/29/2021  ? ?Lab Results  ?Component Value Date  ? CHOL 194 05/29/2021  ? ?Lab Results  ?Component Value Date  ? HDL 41 05/29/2021  ? ?Lab Results  ?Component Value Date  ? LDLCALC 132 (H) 05/29/2021  ? ?Lab Results  ?Component Value Date  ? TRIG 104 05/29/2021  ? ?Lab Results  ?Component Value Date  ? CHOLHDL 4.7 05/29/2021  ? ?Lab Results  ?Component Value Date  ? HGBA1C 5.3 08/29/2015  ? ? ?  ?Assessment & Plan:  ? ?Problem List Items Addressed This Visit   ? ?  ? Other  ? GAD (generalized anxiety disorder) - Primary  ?  We discussed options.  I do not think going up on the Lexapro is going to make a big change in his current symptoms.  Interestingly his GAD-7 score is very similar to what it was 8 weeks ago but his depression score has more than doubled.  I really think he would benefit from formal therapy/counseling I think having this traumatic injury that has completely impacted his ability to work is really affecting his mood and even self worth to some degree.  He says he will think about it and he is open to it he wants to do the testing coming up at the end of the month first and maybe have a better understanding of where things are going.  We will switch to Cymbalta.  I think it could help with his mood especially more depression symptoms that he seems to be struggling more with over the last couple of months as well as maybe even improve his pain by 25 to 30% which I think could be helpful.  New prescription sent to pharmacy had like to see him back in about 5 or 6 weeks. ?  ?  ?  Relevant Medications  ?  DULoxetine (CYMBALTA) 30 MG capsule  ? ? ?Meds ordered this encounter  ?Medications  ? DULoxetine (CYMBALTA) 30 MG capsule  ?  Sig: Take 1 capsule (30 mg total) by mouth daily.  ?  Dispense:  30 capsule  ?  Refill:  1  ? ? ?Follow-up: Return in about 5 weeks (around 08/30/2021) for New start medication.  ? ?I spent 25 minutes on the day of the encounter to include pre-visit record review, face-to-face time with the patient and post visit ordering of test. ? ? ?Beatrice Lecher, MD ?

## 2021-08-28 ENCOUNTER — Encounter: Payer: Self-pay | Admitting: Family Medicine

## 2021-08-28 ENCOUNTER — Ambulatory Visit: Payer: BC Managed Care – PPO | Admitting: Family Medicine

## 2021-08-28 VITALS — BP 121/62 | HR 62 | Resp 18 | Ht 76.0 in | Wt 242.0 lb

## 2021-08-28 DIAGNOSIS — R0602 Shortness of breath: Secondary | ICD-10-CM | POA: Diagnosis not present

## 2021-08-28 DIAGNOSIS — F411 Generalized anxiety disorder: Secondary | ICD-10-CM | POA: Diagnosis not present

## 2021-08-28 MED ORDER — DULOXETINE HCL 30 MG PO CPEP: 30.0000 mg | ORAL_CAPSULE | Freq: Every day | ORAL | 1 refills | Status: DC

## 2021-08-28 NOTE — Assessment & Plan Note (Signed)
Suspect secondary to deconditioning.  Shortness of breath on exertion-we discussed over the next month really working on exercising and walking and being more consistent with that to see if it improves.  Thus far he has not experienced any palpitations or chest pain.  Certainly if that happens please let us know sooner rather than later.  If it does not improved with increased activity then we will do further cardiac and pulmonary work-up.  We discussed moving forward with stress test spirometry etc. if needed.  Okay with waiting until of the follow-up appointment in 5 weeks ?

## 2021-08-28 NOTE — Assessment & Plan Note (Signed)
Overall he is actually doing really well on the Cymbalta and has had significant decrease in his depressive symptoms.  I also think finally moving toward some resolution after his work injury has also been helpful as well we discussed trying to set a routine and also trying to start exercising more consistently it would also be helpful.  For now continue with 30 mg and follow-up in about 5 weeks. ?

## 2021-08-28 NOTE — Progress Notes (Signed)
? ?Established Patient Office Visit ? ?Subjective:  ?Patient ID: Terry Vaughan, male    DOB: 1961-03-08  Age: 61 y.o. MRN: [id number] ? ?CC:  ?Chief Complaint  ?Patient presents with  ? Anxiety  ?  Follow up. Patient states cymbalta is working well for him.   ? ? ?HPI ?Terry Vaughan presents for follow-up of generalized anxiety disorder.  Since I last saw him he did have his strength testing for his disability.  He says they are still working out the details.  He does feel like the Cymbalta has been helpful he has not noticed any side effects.  The previous medication made him felt a little bit lightheaded with changes in position but this 1 does not and he actually feels like its been effective.  Thus far.  He has had anxiety for quite some time but had recently been experiencing some increased depressive symptoms so we decided to switch medications.  His PHQ-9 score dropped from 14 down to 3.  He feels like the medication is really started to kick in and help over the last week and a half he says he felt like it took a little longer for it to start working than his previous medication. ? ? ?  08/28/2021  ?  4:30 PM 07/26/2021  ?  3:51 PM 05/28/2021  ?  8:06 AM 03/26/2021  ?  7:16 AM 02/12/2021  ? 11:30 AM  ?Depression screen PHQ 2/9  ?Decreased Interest 0 _0 ?Down, Depressed, Hopeless 0 _1 ?PHQ - 2 Score 0 _2 ?Altered sleeping _3 ?Tired, decreased energy _4 ?Change in appetite 0 0 0 0 0  ?Feeling bad or failure about yourself  0 0 _5 ?Trouble concentrating 1 2 0 2 3  ?Moving slowly or fidgety/restless 0 _6 ?Suicidal thoughts 0 0 0 0 0  ?PHQ-9 Score _7 ?Difficult doing work/chores Not difficult at all Very difficult Somewhat difficult Somewhat difficult Very difficult  ? ? ?He also wanted to let me know that when he was doing the strength/endurance test for the disability that his blood pressure and pulse were shooting up with certain activities like walking  upstairs etc.  In fact the person conducting the test had him sit and rest for a few minutes in between tasks.  He says his blood pressure went as high as 160 during the activities.  But he also has not been as physically active over this last year after his shoulder injury.  He notices he gets more short of breath with activities.  He denies any chest pain or palpitations with activity.  Blood pressure looks great here today. ? ? ?Past Medical History:  ?Diagnosis Date  ? Kidney stone   ? Perforated bowel ([place]) 10/2012  ? ? ?Past Surgical History:  ?Procedure Laterality Date  ? CHOLECYSTECTOMY  09/2013  ? Dr. Janae Bridgeman, acute cholecystitis  ? PARTIAL COLECTOMY  10/2012  ? Dr. Janae Bridgeman, perforated bowel  ? PLEURAL SCARIFICATION    ? ? ?History reviewed. No pertinent family history. ? ?Social History  ? ?Socioeconomic History  ? Marital status: Divorced  ?  Spouse name: Not on file  ? Number of children: Not on file  ? Years of education: Not on file  ? Highest education level: Not on file  ?Occupational History  ?  Not on file  ?Tobacco Use  ? Smoking status: Former  ? Smokeless tobacco: Never  ?Substance and Sexual Activity  ? Alcohol use: No  ? Drug use: No  ? Sexual activity: Not on file  ?Other Topics Concern  ? Not on file  ?Social History Narrative  ? Not on file  ? ?Social Determinants of Health  ? ?Financial Resource Strain: Not on file  ?Food Insecurity: Not on file  ?Transportation Needs: Not on file  ?Physical Activity: Not on file  ?Stress: Not on file  ?Social Connections: Not on file  ?Intimate Partner Violence: Not on file  ? ? ?Outpatient Medications Prior to Visit  ?Medication Sig Dispense Refill  ? Cetirizine HCl (ZYRTEC PO) Take by mouth.    ? pantoprazole (PROTONIX) 40 MG tablet Take 1 tablet (40 mg total) by mouth daily. 90 tablet 3  ? tamsulosin (FLOMAX) 0.4 MG CAPS capsule TAKE 1 CAPSULE(0.4 MG) BY MOUTH DAILY 90 capsule 3  ? DULoxetine (CYMBALTA) 30 MG capsule Take 1 capsule (30 mg total) by mouth  daily. 30 capsule 1  ? ?No facility-administered medications prior to visit.  ? ? ?Allergies  ?Allergen Reactions  ? Buprenorphine Hcl   ?  Other reaction(s): Unknown  ? Rozerem [Ramelteon] Other (See Comments)  ?  dizziness  ? Iodinated Contrast Media   ?  Other reaction(s): Other ?Other reaction(s): Confusion  ? Meperidine Hcl Nausea Only  ? Morphine And Related Nausea Only  ? Promethazine Hcl   ?  Other reaction(s): Confusion  ? ? ?ROS ?Review of Systems ? ?  ?Objective:  ?  ?Physical Exam ?Constitutional:   ?   Appearance: Normal appearance. He is well-developed.  ?HENT:  ?   Head: Normocephalic and atraumatic.  ?Cardiovascular:  ?   Rate and Rhythm: Normal rate and regular rhythm.  ?   Heart sounds: Normal heart sounds.  ?Pulmonary:  ?   Effort: Pulmonary effort is normal.  ?   Breath sounds: Normal breath sounds.  ?Skin: ?   General: Skin is warm and dry.  ?Neurological:  ?   Mental Status: He is alert and oriented to person, place, and time. Mental status is at baseline.  ?Psychiatric:     ?   Behavior: Behavior normal.  ? ? ?BP 121/62   Pulse 62   Resp 18   Ht 6' 4" (1.93 m)   Wt 242 lb (109.8 kg)   SpO2 96%   BMI 29.46 kg/m?  ?Wt Readings from Last 3 Encounters:  ?08/28/21 242 lb (109.8 kg)  ?07/26/21 245 lb (111.1 kg)  ?05/28/21 241 lb (109.3 kg)  ? ? ? ?There are no preventive care reminders to display for this patient. ? ?There are no preventive care reminders to display for this patient. ? ?Lab Results  ?Component Value Date  ? TSH 1.30 08/29/2015  ? ?Lab Results  ?Component Value Date  ? WBC 5.7 12/02/2017  ? HGB 13.8 12/02/2017  ? HCT 40.7 12/02/2017  ? MCV 91.5 12/02/2017  ? PLT 254 12/02/2017  ? ?Lab Results  ?Component Value Date  ? NA 143 05/29/2021  ? K 4.7 05/29/2021  ? CO2 30 05/29/2021  ? GLUCOSE 83 05/29/2021  ? BUN 12 05/29/2021  ? CREATININE 1.13 05/29/2021  ? BILITOT 0.7 05/29/2021  ? ALKPHOS 89 06/29/2016  ? AST 14 05/29/2021  ? ALT 13 05/29/2021  ? PROT 6.2 05/29/2021  ? ALBUMIN  4.3 06/29/2016  ? CALCIUM 9.1 05/29/2021  ? EGFR  74 05/29/2021  ? ?Lab Results  ?Component Value Date  ? CHOL 194 05/29/2021  ? ?Lab Results  ?Component Value Date  ? HDL 41 05/29/2021  ? ?Lab Results  ?Component Value Date  ? LDLCALC 132 (H) 05/29/2021  ? ?Lab Results  ?Component Value Date  ? TRIG 104 05/29/2021  ? ?Lab Results  ?Component Value Date  ? CHOLHDL 4.7 05/29/2021  ? ?Lab Results  ?Component Value Date  ? HGBA1C 5.3 08/29/2015  ? ? ?  ?Assessment & Plan:  ? ?Problem List Items Addressed This Visit   ? ?  ? Other  ? SOB (shortness of breath) on exertion  ?  Suspect secondary to deconditioning.  Shortness of breath on exertion-we discussed over the next month really working on exercising and walking and being more consistent with that to see if it improves.  Thus far he has not experienced any palpitations or chest pain.  Certainly if that happens please let us know sooner rather than later.  If it does not improved with increased activity then we will do further cardiac and pulmonary work-up.  We discussed moving forward with stress test spirometry etc. if needed.  Okay with waiting until of the follow-up appointment in 5 weeks ?  ?  ? GAD (generalized anxiety disorder) - Primary  ?  Overall he is actually doing really well on the Cymbalta and has had significant decrease in his depressive symptoms.  I also think finally moving toward some resolution after his work injury has also been helpful as well we discussed trying to set a routine and also trying to start exercising more consistently it would also be helpful.  For now continue with 30 mg and follow-up in about 5 weeks. ?  ?  ? Relevant Medications  ? DULoxetine (CYMBALTA) 30 MG capsule  ? ? ?Meds ordered this encounter  ?Medications  ? DULoxetine (CYMBALTA) 30 MG capsule  ?  Sig: Take 1 capsule (30 mg total) by mouth daily.  ?  Dispense:  30 capsule  ?  Refill:  1  ? ? ?Follow-up: Return in about 5 weeks (around 10/02/2021) for mood.  ? ?I spent  32 minutes on the day of the encounter to include pre-visit record review, face-to-face time with the patient and post visit ordering of test. ? ? ?Beatrice Lecher, MD ?

## 2021-10-02 ENCOUNTER — Ambulatory Visit: Payer: BC Managed Care – PPO | Admitting: Family Medicine

## 2021-10-09 ENCOUNTER — Other Ambulatory Visit: Payer: Self-pay | Admitting: Family Medicine

## 2021-11-25 ENCOUNTER — Other Ambulatory Visit: Payer: Self-pay | Admitting: Family Medicine

## 2021-11-25 DIAGNOSIS — F411 Generalized anxiety disorder: Secondary | ICD-10-CM

## 2022-01-12 ENCOUNTER — Other Ambulatory Visit: Payer: Self-pay | Admitting: Family Medicine

## 2022-01-25 ENCOUNTER — Telehealth: Payer: Self-pay | Admitting: Family Medicine

## 2022-01-25 NOTE — Telephone Encounter (Signed)
Letter printed and placed in [place] B box

## 2022-01-25 NOTE — Telephone Encounter (Signed)
Patient has jury duty and needs documentation sent over to court his juror number is [phone]

## 2022-01-26 ENCOUNTER — Other Ambulatory Visit: Payer: Self-pay | Admitting: Family Medicine

## 2022-01-26 DIAGNOSIS — F411 Generalized anxiety disorder: Secondary | ICD-10-CM

## 2022-02-01 NOTE — Telephone Encounter (Signed)
LVM informing Mr. Payne that his letter is ready for pick up at the front desk.  T. Makyra Corprew, CMA 

## 2022-02-05 ENCOUNTER — Other Ambulatory Visit: Payer: Self-pay | Admitting: Family Medicine

## 2022-02-05 DIAGNOSIS — K2100 Gastro-esophageal reflux disease with esophagitis, without bleeding: Secondary | ICD-10-CM

## 2022-03-12 ENCOUNTER — Encounter: Payer: Self-pay | Admitting: Family Medicine

## 2022-03-12 ENCOUNTER — Ambulatory Visit: Payer: BC Managed Care – PPO | Admitting: Family Medicine

## 2022-03-12 VITALS — BP 131/76 | HR 80 | Ht 76.0 in | Wt 236.0 lb

## 2022-03-12 DIAGNOSIS — J302 Other seasonal allergic rhinitis: Secondary | ICD-10-CM

## 2022-03-12 DIAGNOSIS — J069 Acute upper respiratory infection, unspecified: Secondary | ICD-10-CM

## 2022-03-12 MED ORDER — DEXAMETHASONE SODIUM PHOSPHATE 10 MG/ML IJ SOLN: 10.0000 mg | Freq: Once | INTRAMUSCULAR | Status: AC

## 2022-03-12 NOTE — Patient Instructions (Signed)
If no better by Friday, call and we will send in an antibiotic

## 2022-03-12 NOTE — Progress Notes (Signed)
   Acute Office Visit  Subjective:     Patient ID: Terry Vaughan, male    DOB: 11-07-1960, 61 y.o.   MRN: [id number]  Chief Complaint  Patient presents with   Sinus Problem    HPI Patient is in today for allergic rhinitis associated with some sinus pressure. Symptoms started 3 days ago. He does not some sinus tenderness to his maxillary sinuses.   Review of Systems  Constitutional:  Negative for chills and fever.  HENT:  Positive for congestion.   Respiratory:  Negative for cough and shortness of breath.   Cardiovascular:  Negative for chest pain.  Neurological:  Negative for headaches.        Objective:    BP 131/76   Pulse 80   Ht 6\' 4"  (1.93 m)   Wt 236 lb (107 kg)   SpO2 100%   BMI 28.73 kg/m    Physical Exam Vitals and nursing note reviewed.  Constitutional:      General: He is not in acute distress.    Appearance: Normal appearance.  HENT:     Head: Normocephalic and atraumatic.     Right Ear: Tympanic membrane, ear canal and external ear normal.     Left Ear: Tympanic membrane, ear canal and external ear normal.     Nose: Nose normal.     Mouth/Throat:     Pharynx: Posterior oropharyngeal erythema present.  Eyes:     Conjunctiva/sclera: Conjunctivae normal.  Cardiovascular:     Rate and Rhythm: Normal rate and regular rhythm.  Pulmonary:     Effort: Pulmonary effort is normal.     Breath sounds: Normal breath sounds.  Neurological:     General: No focal deficit present.     Mental Status: He is alert and oriented to person, place, and time.  Psychiatric:        Mood and Affect: Mood normal.        Behavior: Behavior normal.        Thought Content: Thought content normal.        Judgment: Judgment normal.     No results found for any visits on 03/12/22.      Assessment & Plan:   Problem List Items Addressed This Visit       Respiratory   Allergic rhinitis    - recommended continued zyrtec and flonase.       Viral URI - Primary     - since it has only been 3 days of symptoms likely this is viral in nature. Have given a decadron shot in clinic to help decrease some of the nasal inflammation he is having - if no better by the end of the week I told him to let us know and we will send in an abx since it is likely it turned bacterial at that point.        No orders of the defined types were placed in this encounter.   Return if symptoms worsen or fail to improve.  Owens Loffler, DO

## 2022-03-12 NOTE — Assessment & Plan Note (Signed)
-   since it has only been 3 days of symptoms likely this is viral in nature. Have given a decadron shot in clinic to help decrease some of the nasal inflammation he is having - if no better by the end of the week I told him to let us know and we will send in an abx since it is likely it turned bacterial at that point.

## 2022-03-12 NOTE — Assessment & Plan Note (Signed)
-   recommended continued zyrtec and flonase.

## 2022-03-14 ENCOUNTER — Other Ambulatory Visit: Payer: Self-pay | Admitting: Family Medicine

## 2022-03-14 DIAGNOSIS — B9689 Other specified bacterial agents as the cause of diseases classified elsewhere: Secondary | ICD-10-CM

## 2022-03-14 DIAGNOSIS — R051 Acute cough: Secondary | ICD-10-CM

## 2022-03-14 MED ORDER — BENZONATATE 100 MG PO CAPS: 100.0000 mg | ORAL_CAPSULE | Freq: Three times a day (TID) | ORAL | 0 refills | Status: AC | PRN

## 2022-03-14 MED ORDER — BENZONATATE 100 MG PO CAPS: 100.0000 mg | ORAL_CAPSULE | Freq: Three times a day (TID) | ORAL | 0 refills | Status: DC | PRN

## 2022-03-14 MED ORDER — AMOXICILLIN-POT CLAVULANATE 875-125 MG PO TABS: 1.0000 | ORAL_TABLET | Freq: Two times a day (BID) | ORAL | 0 refills | Status: AC

## 2022-03-14 NOTE — Progress Notes (Signed)
Pt called stating no better since visit on Monday. At that time he was 3 days into illness. He is still no better and now meets the window for abx for acute bacterial sinusitis. Have sent to pharmacy and notified pt. Have also sent in [place] for cough prn

## 2022-03-27 ENCOUNTER — Other Ambulatory Visit: Payer: Self-pay | Admitting: Family Medicine

## 2022-03-27 DIAGNOSIS — F411 Generalized anxiety disorder: Secondary | ICD-10-CM

## 2022-06-01 ENCOUNTER — Other Ambulatory Visit: Payer: Self-pay | Admitting: Family Medicine

## 2022-06-01 DIAGNOSIS — F411 Generalized anxiety disorder: Secondary | ICD-10-CM

## 2022-12-11 ENCOUNTER — Telehealth: Payer: Self-pay | Admitting: Family Medicine

## 2022-12-11 ENCOUNTER — Other Ambulatory Visit: Payer: Self-pay | Admitting: Family Medicine

## 2022-12-11 NOTE — Telephone Encounter (Signed)
Patient requests refills on Flomax 0.4mg   Does patient need to schedule appt first? Karin Golden on [address] [place] phone [phone]

## 2022-12-12 ENCOUNTER — Other Ambulatory Visit: Payer: Self-pay | Admitting: Family Medicine

## 2022-12-12 NOTE — Telephone Encounter (Signed)
Spoke with patient, patient states that he has a new insurance, [employer], that his insurance  requires him to go to [facility] facilities and none of the office he has contacted are taking new patients, patient has one pill left, and what like to know what he should do, please advise, thanks.

## 2022-12-12 NOTE — Telephone Encounter (Signed)
Please call pt and advise him that he will need to schedule an appt and have labs done to get refills on flomax and  duloxetine. Thank you

## 2022-12-12 NOTE — Telephone Encounter (Signed)
He does need to schedule an appt to see Dr. Linford Arnold. I will send a 30 day supply for now. Please advise him of this. Thank you

## 2022-12-12 NOTE — Telephone Encounter (Signed)
Will refill for a short supply but this is not our problem. He has known this when he got the insurance and actually before he got his new insurance he would do this so he will need to figure something out because we cannot continue to give this to him without seeing him and getting lab work.

## 2023-02-03 ENCOUNTER — Other Ambulatory Visit: Payer: Self-pay | Admitting: Family Medicine

## 2023-02-10 ENCOUNTER — Encounter: Payer: Self-pay | Admitting: Family Medicine

## 2023-02-10 ENCOUNTER — Other Ambulatory Visit: Payer: Self-pay | Admitting: Family Medicine

## 2023-02-10 DIAGNOSIS — K2100 Gastro-esophageal reflux disease with esophagitis, without bleeding: Secondary | ICD-10-CM

## 2023-02-10 NOTE — Telephone Encounter (Signed)
So did he still want to continue to see Dr. Linford Arnold? Otherwise we will need to take her off as his pcp.

## 2023-02-10 NOTE — Telephone Encounter (Signed)
Spoke with patient. Patient states that his medicare doesn't go into affect unti December, and patient has to go to [facility] with his current [employer] until December, thanks.

## 2023-02-10 NOTE — Telephone Encounter (Signed)
Please call pt and have him schedule a f/u appt  for refills on Protonix thank you.

## 2023-02-10 NOTE — Telephone Encounter (Signed)
Patient states that he wants to continue he has to wait until December to return for when his Medicare goes in effect, thanks.

## 2023-03-05 ENCOUNTER — Other Ambulatory Visit: Payer: Self-pay | Admitting: Family Medicine

## 2023-03-05 DIAGNOSIS — K2100 Gastro-esophageal reflux disease with esophagitis, without bleeding: Secondary | ICD-10-CM
